# Patient Record
Sex: Male | Born: 1965 | Race: White | Hispanic: No | Smoking: Former smoker
Health system: Southern US, Community
[De-identification: ages and names within clinical notes are randomized; demographics above are authoritative.]

## PROBLEM LIST (undated history)

## (undated) DIAGNOSIS — B019 Varicella without complication: Secondary | ICD-10-CM

## (undated) DIAGNOSIS — E7800 Pure hypercholesterolemia, unspecified: Secondary | ICD-10-CM

## (undated) DIAGNOSIS — N4289 Other specified disorders of prostate: Secondary | ICD-10-CM

## (undated) DIAGNOSIS — J309 Allergic rhinitis, unspecified: Secondary | ICD-10-CM

## (undated) HISTORY — PX: LIPOMA EXCISION: SHX5283

## (undated) HISTORY — PX: OTHER SURGICAL HISTORY: SHX169

## (undated) HISTORY — DX: Varicella without complication: B01.9

---

## 2010-11-14 ENCOUNTER — Emergency Department: Payer: Self-pay | Admitting: Emergency Medicine

## 2012-01-12 ENCOUNTER — Emergency Department: Payer: Self-pay | Admitting: Emergency Medicine

## 2012-01-12 LAB — COMPREHENSIVE METABOLIC PANEL
Albumin: 4.2 g/dL (ref 3.4–5.0)
Alkaline Phosphatase: 59 U/L (ref 50–136)
Anion Gap: 6 — ABNORMAL LOW (ref 7–16)
BUN: 12 mg/dL (ref 7–18)
Bilirubin,Total: 0.7 mg/dL (ref 0.2–1.0)
Calcium, Total: 8.9 mg/dL (ref 8.5–10.1)
Chloride: 108 mmol/L — ABNORMAL HIGH (ref 98–107)
Co2: 28 mmol/L (ref 21–32)
Creatinine: 0.66 mg/dL (ref 0.60–1.30)
EGFR (African American): >60
EGFR (Non-African Amer.): >60
Glucose: 92 mg/dL (ref 65–99)
Potassium: 3.6 mmol/L (ref 3.5–5.1)
SGPT (ALT): 21 U/L (ref 12–78)
Sodium: 142 mmol/L (ref 136–145)

## 2012-01-12 LAB — CBC
HCT: 44.3 % (ref 40.0–52.0)
HGB: 15 g/dL (ref 13.0–18.0)
MCHC: 33.9 g/dL (ref 32.0–36.0)
RBC: 5.01 10*6/uL (ref 4.40–5.90)
RDW: 13.7 % (ref 11.5–14.5)
WBC: 4.4 10*3/uL (ref 3.8–10.6)

## 2014-12-01 ENCOUNTER — Ambulatory Visit (INDEPENDENT_AMBULATORY_CARE_PROVIDER_SITE_OTHER): Payer: 59 | Admitting: Podiatry

## 2014-12-01 ENCOUNTER — Encounter: Payer: Self-pay | Admitting: Podiatry

## 2014-12-01 ENCOUNTER — Ambulatory Visit (INDEPENDENT_AMBULATORY_CARE_PROVIDER_SITE_OTHER): Payer: 59

## 2014-12-01 VITALS — BP 138/92 | HR 61 | Resp 18

## 2014-12-01 DIAGNOSIS — M722 Plantar fascial fibromatosis: Secondary | ICD-10-CM

## 2014-12-01 MED ORDER — METHYLPREDNISOLONE 4 MG PO TBPK: ORAL_TABLET | ORAL | Status: DC

## 2014-12-01 MED ORDER — MELOXICAM 15 MG PO TABS: 15.0000 mg | ORAL_TABLET | Freq: Every day | ORAL | Status: DC

## 2014-12-01 NOTE — Patient Instructions (Addendum)
Start taking the medrol dose pack. Once finished take the mobic (meloxicam). Do not take together If was nice to meet you today. If you have any questions or any further concerns, please feel fee to give me a call. You can call our office at [phone] or please feel fee to send me a message through [place].    Plantar Fasciitis With Rehab The plantar fascia is a fibrous, ligament-like, soft-tissue structure that spans the bottom of the foot. Plantar fasciitis, also called heel spur syndrome, is a condition that causes pain in the foot due to inflammation of the tissue. SYMPTOMS   Pain and tenderness on the underneath side of the foot.  Pain that worsens with standing or walking. CAUSES  Plantar fasciitis is caused by irritation and injury to the plantar fascia on the underneath side of the foot. Common mechanisms of injury include:  Direct trauma to bottom of the foot.  Damage to a small nerve that runs under the foot where the main fascia attaches to the heel bone.  Stress placed on the plantar fascia due to bone spurs. RISK INCREASES WITH:   Activities that place stress on the plantar fascia (running, jumping, pivoting, or cutting).  Poor strength and flexibility.  Improperly fitted shoes.  Tight calf muscles.  Flat feet.  Failure to warm-up properly before activity.  Obesity. PREVENTION  Warm up and stretch properly before activity.  Allow for adequate recovery between workouts.  Maintain physical fitness:  Strength, flexibility, and endurance.  Cardiovascular fitness.  Maintain a health body weight.  Avoid stress on the plantar fascia.  Wear properly fitted shoes, including arch supports for individuals who have flat feet. PROGNOSIS  If treated properly, then the symptoms of plantar fasciitis usually resolve without surgery. However, occasionally surgery is necessary. RELATED COMPLICATIONS   Recurrent symptoms that may result in a chronic  condition.  Problems of the lower back that are caused by compensating for the injury, such as limping.  Pain or weakness of the foot during push-off following surgery.  Chronic inflammation, scarring, and partial or complete fascia tear, occurring more often from repeated injections. TREATMENT  Treatment initially involves the use of ice and medication to help reduce pain and inflammation. The use of strengthening and stretching exercises may help reduce pain with activity, especially stretches of the Achilles tendon. These exercises may be performed at home or with a therapist. Your caregiver may recommend that you use heel cups of arch supports to help reduce stress on the plantar fascia. Occasionally, corticosteroid injections are given to reduce inflammation. If symptoms persist for greater than 6 months despite non-surgical (conservative), then surgery may be recommended.  MEDICATION   If pain medication is necessary, then nonsteroidal anti-inflammatory medications, such as aspirin and ibuprofen, or other minor pain relievers, such as acetaminophen, are often recommended.  Do not take pain medication within 7 days before surgery.  Prescription pain relievers may be given if deemed necessary by your caregiver. Use only as directed and only as much as you need.  Corticosteroid injections may be given by your caregiver. These injections should be reserved for the most serious cases, because they may only be given a certain number of times. HEAT AND COLD  Cold treatment (icing) relieves pain and reduces inflammation. Cold treatment should be applied for 10 to 15 minutes every 2 to 3 hours for inflammation and pain and immediately after any activity that aggravates your symptoms. Use ice packs or massage the area with a piece  of ice (ice massage).  Heat treatment may be used prior to performing the stretching and strengthening activities prescribed by your caregiver, physical therapist, or  athletic trainer. Use a heat pack or soak the injury in warm water. SEEK IMMEDIATE MEDICAL CARE IF:  Treatment seems to offer no benefit, or the condition worsens.  Any medications produce adverse side effects. EXERCISES RANGE OF MOTION (ROM) AND STRETCHING EXERCISES - Plantar Fasciitis (Heel Spur Syndrome) These exercises may help you when beginning to rehabilitate your injury. Your symptoms may resolve with or without further involvement from your physician, physical therapist or athletic trainer. While completing these exercises, remember:   Restoring tissue flexibility helps normal motion to return to the joints. This allows healthier, less painful movement and activity.  An effective stretch should be held for at least 30 seconds.  A stretch should never be painful. You should only feel a gentle lengthening or release in the stretched tissue. RANGE OF MOTION - Toe Extension, Flexion  Sit with your right / left leg crossed over your opposite knee.  Grasp your toes and gently pull them back toward the top of your foot. You should feel a stretch on the bottom of your toes and/or foot.  Hold this stretch for __________ seconds.  Now, gently pull your toes toward the bottom of your foot. You should feel a stretch on the top of your toes and or foot.  Hold this stretch for __________ seconds. Repeat __________ times. Complete this stretch __________ times per day.  RANGE OF MOTION - Ankle Dorsiflexion, Active Assisted  Remove shoes and sit on a chair that is preferably not on a carpeted surface.  Place right / left foot under knee. Extend your opposite leg for support.  Keeping your heel down, slide your right / left foot back toward the chair until you feel a stretch at your ankle or calf. If you do not feel a stretch, slide your bottom forward to the edge of the chair, while still keeping your heel down.  Hold this stretch for __________ seconds. Repeat __________ times. Complete  this stretch __________ times per day.  STRETCH - Gastroc, Standing  Place hands on wall.  Extend right / left leg, keeping the front knee somewhat bent.  Slightly point your toes inward on your back foot.  Keeping your right / left heel on the floor and your knee straight, shift your weight toward the wall, not allowing your back to arch.  You should feel a gentle stretch in the right / left calf. Hold this position for __________ seconds. Repeat __________ times. Complete this stretch __________ times per day. STRETCH - Soleus, Standing  Place hands on wall.  Extend right / left leg, keeping the other knee somewhat bent.  Slightly point your toes inward on your back foot.  Keep your right / left heel on the floor, bend your back knee, and slightly shift your weight over the back leg so that you feel a gentle stretch deep in your back calf.  Hold this position for __________ seconds. Repeat __________ times. Complete this stretch __________ times per day. STRETCH - Gastrocsoleus, Standing  Note: This exercise can place a lot of stress on your foot and ankle. Please complete this exercise only if specifically instructed by your caregiver.   Place the ball of your right / left foot on a step, keeping your other foot firmly on the same step.  Hold on to the wall or a rail for balance.  Slowly lift your other foot, allowing your body weight to press your heel down over the edge of the step.  You should feel a stretch in your right / left calf.  Hold this position for __________ seconds.  Repeat this exercise with a slight bend in your right / left knee. Repeat __________ times. Complete this stretch __________ times per day.  STRENGTHENING EXERCISES - Plantar Fasciitis (Heel Spur Syndrome)  These exercises may help you when beginning to rehabilitate your injury. They may resolve your symptoms with or without further involvement from your physician, physical therapist or athletic  trainer. While completing these exercises, remember:   Muscles can gain both the endurance and the strength needed for everyday activities through controlled exercises.  Complete these exercises as instructed by your physician, physical therapist or athletic trainer. Progress the resistance and repetitions only as guided. STRENGTH - Towel Curls  Sit in a chair positioned on a non-carpeted surface.  Place your foot on a towel, keeping your heel on the floor.  Pull the towel toward your heel by only curling your toes. Keep your heel on the floor.  If instructed by your physician, physical therapist or athletic trainer, add ____________________ at the end of the towel. Repeat __________ times. Complete this exercise __________ times per day. STRENGTH - Ankle Inversion  Secure one end of a rubber exercise band/tubing to a fixed object (table, pole). Loop the other end around your foot just before your toes.  Place your fists between your knees. This will focus your strengthening at your ankle.  Slowly, pull your big toe up and in, making sure the band/tubing is positioned to resist the entire motion.  Hold this position for __________ seconds.  Have your muscles resist the band/tubing as it slowly pulls your foot back to the starting position. Repeat __________ times. Complete this exercises __________ times per day.    This information is not intended to replace advice given to you by your health care provider. Make sure you discuss any questions you have with your health care provider.   Document Released: 12/19/2004 Document Revised: 05/05/2014 Document Reviewed: 04/02/2008 Elsevier Interactive Patient Education [employer].

## 2014-12-01 NOTE — Progress Notes (Signed)
   Subjective:    Patient ID: Roger Kim, male    DOB: 1965/03/06, 49 y.o.   MRN: [id number]  HPI   49 year old male presents the operative concerns her right heel pain which has been ongoing the last 3 months. He states the majority of pain is when he sits down for some time he gets back up to stand. He does have some pain after he stands all day at work while working on [employer], [employer]. He denies any swelling or redness. No recent injury or trauma. No change increased activity recently. He did changes shoes time the pain started. He said no previous treatment. No tingling or numbness. No claudication symptoms. The pain does not wake him up at night. No other complaints at this time.  Review of Systems  All other systems reviewed and are negative.      Objective:   Physical Exam General: AAO x3, NAD  Dermatological: Skin is warm, dry and supple bilateral. Nails x 10 are well manicured; remaining integument appears unremarkable at this time. There are no open sores, no preulcerative lesions, no rash or signs of infection present.  Vascular: Dorsalis Pedis artery and Posterior Tibial artery pedal pulses are 2/4 bilateral with immedate capillary fill time. Pedal hair growth present. No varicosities and no lower extremity edema present bilateral. There is no pain with calf compression, swelling, warmth, erythema.   Neruologic: Grossly intact via light touch bilateral. Vibratory intact via tuning fork bilateral. Protective threshold with Semmes Wienstein monofilament intact to all pedal sites bilateral. Patellar and Achilles deep tendon reflexes 2+ bilateral. No Babinski or clonus noted bilateral.   Musculoskeletal:  There is tenderness palpation along the plantar medial tubercle of the calcaneus at the insertion of the plantar fascia on the right foot. There is no canal course of plantar fascial in the arch foot is no evidence of tearing. There is no pain with lateral  compression of the calcaneus or pain with vibratory sensation. There is of course/insertional the achilles tendon. No other areas of tenderness to bilateral lower extremities. No pain, crepitus, or limitation noted with foot and ankle range of motion bilateral. Muscular strength 5/5 in all groups tested bilateral.  Gait: Unassisted, Nonantalgic.       Assessment & Plan:   49 year old male right heel pain, likely plantar fasciitis -Treatment options discussed including all alternatives, risks, and complications -Etiology of symptoms were discussed -X-rays were obtained and reviewed with the patient.  -Patient elects to proceed with steroid injection into the right heel. Under sterile skin preparation, a total of 2.5cc of kenalog 10, 0.5% Marcaine plain, and 2% lidocaine plain were infiltrated into the symptomatic area without complication. A band-aid was applied. Patient tolerated the injection well without complication. Post-injection care with discussed with the patient. Discussed with the patient to ice the area over the next couple of days to help prevent a steroid flare.  - Prescribed a Medrol Dosepak. Once this is complete he can start meloxicam which is also  Prescribed. -Plantar fascial brace dispensed -Ice and stretching exercises daily. -continue supportive shoe gear. -discuss orthotics. -Follow-up in 3 weeks or sooner if any problems arise. In the meantime, encouraged to call the office with any questions, concerns, change in symptoms.   Celesta Gentile, DPM

## 2014-12-22 ENCOUNTER — Ambulatory Visit (INDEPENDENT_AMBULATORY_CARE_PROVIDER_SITE_OTHER): Payer: 59 | Admitting: Podiatry

## 2014-12-22 ENCOUNTER — Encounter: Payer: Self-pay | Admitting: Podiatry

## 2014-12-22 VITALS — BP 117/75 | HR 73 | Resp 18

## 2014-12-22 DIAGNOSIS — M722 Plantar fascial fibromatosis: Secondary | ICD-10-CM

## 2014-12-22 NOTE — Progress Notes (Signed)
Patient ID: Roger Kim, male   DOB: 24-Sep-1965, 49 y.o.   MRN: [id number]  Subjective: Roger Kim presents to the office today for follow-up evaluation of right heel pain. He states that they are doing much better and was previously on the distal have some discomfort to his heel. They have been icing, stretching, try to wear supportive shoe as much as possible.he has finished her course Medrol Dosepak he is been taken meloxicam without any side effects. He discontinued the plantar fascial brace. He does have a Dr. Felicie Morn plantar fascial insert. No recent injury,. No swelling or redness  No other complaints at this time. No acute changes since last appointment. They deny any systemic complaints such as fevers, chills, nausea, vomiting.  Objective: General: AAO x3, NAD  Dermatological: Skin is warm, dry and supple bilateral. Nails x 10 are well manicured; remaining integument appears unremarkable at this time. There are no open sores, no preulcerative lesions, no rash or signs of infection present.  Vascular: Dorsalis Pedis artery and Posterior Tibial artery pedal pulses are 2/4 bilateral with immedate capillary fill time. Pedal hair growth present. There is no pain with calf compression, swelling, warmth, erythema.   Neruologic: Grossly intact via light touch bilateral. Vibratory intact via tuning fork bilateral. Protective threshold with Semmes Wienstein monofilament intact to all pedal sites bilateral.   Musculoskeletal: There is mild but  continued tenderness palpation along the plantar medial tubercle of the calcaneus at the insertion of the plantar fascia on the right foot. There is no pain along the course of the plantar fascia within the arch of the foot. Plantar fascia appears to be intact bilaterally. There is no pain with lateral compression of the calcaneus and there is no pain with vibratory sensation. There is no pain along the course or insertion of the Achilles tendon. There  are no other areas of tenderness to bilateral lower extremities. No gross boney pedal deformities bilateral. No pain, crepitus, or limitation noted with foot and ankle range of motion bilateral. Muscular strength 5/5 in all groups tested bilateral.  Gait: Unassisted, Nonantalgic.   Assessment: Presents for follow-up evaluation for heel pain, likely plantar fasciitis with improved symptoms.   Plan: -Treatment options discussed including all alternatives, risks, and complications -Patient elects to proceed with steroid injection into the right heel. Under sterile skin preparation, a total of 2.5cc of kenalog 10, 0.5% Marcaine plain, and 2% lidocaine plain were infiltrated into the symptomatic area without complication. A band-aid was applied. Patient tolerated the injection well without complication. Post-injection care with discussed with the patient. Discussed with the patient to ice the area over the next couple of days to help prevent a steroid flare.  -Removal of plantar fascial taking was applied. -Ice and stretching exercises on a daily basis. -Continue supportive shoe gear. Discussed CMO -Continue mobic -Follow-up in 3 weeks or sooner if any problems arise. In the meantime, encouraged to call the office with any questions, concerns, change in symptoms.   Celesta Gentile, DPM

## 2015-01-12 ENCOUNTER — Ambulatory Visit: Payer: 59 | Admitting: Podiatry

## 2015-11-03 ENCOUNTER — Ambulatory Visit
Admission: EM | Admit: 2015-11-03 | Discharge: 2015-11-03 | Disposition: A | Discharge status: Home / Self Care | Payer: BLUE CROSS/BLUE SHIELD | Attending: Emergency Medicine | Admitting: Emergency Medicine

## 2015-11-03 DIAGNOSIS — J40 Bronchitis, not specified as acute or chronic: Secondary | ICD-10-CM

## 2015-11-03 DIAGNOSIS — J019 Acute sinusitis, unspecified: Secondary | ICD-10-CM

## 2015-11-03 HISTORY — DX: Allergic rhinitis, unspecified: J30.9

## 2015-11-03 MED ORDER — BENZONATATE 200 MG PO CAPS: 200.0000 mg | ORAL_CAPSULE | Freq: Three times a day (TID) | ORAL | 0 refills | Status: DC | PRN

## 2015-11-03 MED ORDER — PREDNISONE 50 MG PO TABS: 50.0000 mg | ORAL_TABLET | Freq: Every day | ORAL | 0 refills | Status: DC

## 2015-11-03 MED ORDER — DOXYCYCLINE HYCLATE 100 MG PO CAPS: 100.0000 mg | ORAL_CAPSULE | Freq: Two times a day (BID) | ORAL | 0 refills | Status: DC

## 2015-11-03 MED ORDER — IBUPROFEN 800 MG PO TABS: 800.0000 mg | ORAL_TABLET | Freq: Three times a day (TID) | ORAL | 0 refills | Status: DC | PRN

## 2015-11-03 MED ORDER — AEROCHAMBER PLUS MISC: 2 refills | Status: DC

## 2015-11-03 MED ORDER — ALBUTEROL SULFATE HFA 108 (90 BASE) MCG/ACT IN AERS: 1.0000 | INHALATION_SPRAY | Freq: Four times a day (QID) | RESPIRATORY_TRACT | 0 refills | Status: DC | PRN

## 2015-11-03 NOTE — Discharge Instructions (Signed)
Take the medication as written. You may take 800 mg of motrin with 1 gram of tylenol up to 3 times a day as needed for pain. This is an effective combination for pain.   Use a neti pot or the NeilMed sinus rinse as often as you want to to reduce nasal congestion. Follow the directions on the box. Continue Mucinex. Restart Flonase.  Go to www.goodrx.com to look up your medications. This will give you a list of where you can find your prescriptions at the most affordable prices.

## 2015-11-03 NOTE — ED Triage Notes (Signed)
Patient complains of cough, congestion, sinus pain and pressure. Patient states that symptoms started 3.5 weeks ago. Patient states that he has tried and failed mucinex and dayquil. Patient states that he feels like congestion has resided in his chest.

## 2015-11-03 NOTE — ED Provider Notes (Signed)
HPI  SUBJECTIVE:  Roger Kim is a 50 y.o. male who presents with 3-1/2 weeks of nasal congestion and sinus pressure along the nasal bridge, purulent nasal drainage, postnasal drip. He reports a cough productive of yellowish mucus. He reports some wheezing particularly worse at night. He tried DayQuil, NyQuil, Mucinex DM. He Mucinex seemed to help his cough. His cough is worse with laughing. He denies dental pain, face swelling, chest pain, shortness of breath, fevers. He states that he is sleeping okay at night. No antipyretic in the past 6-8 hours. States that he has used Flonase in the past with success, but has not had any recently. He has a past medical history of sinusitis and states that these symptoms are identical to previous episodes. He is a former smoker. He has a past medical history of seasonal allergies. No history of diabetes, hypertension, asthma, emphysema, COPD. PMD: None.    Past Medical History:  Diagnosis Date  . Allergic rhinitis     Past Surgical History:  Procedure Laterality Date  . NO PAST SURGERIES      History reviewed. No pertinent family history.  Social History  Substance Use Topics  . Smoking status: Former Research scientist (life sciences)  . Smokeless tobacco: Never Used  . Alcohol use No    No current facility-administered medications for this encounter.   Current Outpatient Prescriptions:  .  Ascorbic Acid (VITAMIN C) 1000 MG tablet, Take 1,000 mg by mouth daily., Disp: , Rfl:  .  Multiple Vitamin (MULTIVITAMIN) capsule, Take 1 capsule by mouth daily., Disp: , Rfl:  .  albuterol (PROVENTIL HFA;VENTOLIN HFA) 108 (90 Base) MCG/ACT inhaler, Inhale 1-2 puffs into the lungs every 6 (six) hours as needed for wheezing or shortness of breath., Disp: 1 Inhaler, Rfl: 0 .  benzonatate (TESSALON) 200 MG capsule, Take 1 capsule (200 mg total) by mouth 3 (three) times daily as needed for cough., Disp: 20 capsule, Rfl: 0 .  doxycycline (VIBRAMYCIN) 100 MG capsule, Take 1 capsule (100  mg total) by mouth 2 (two) times daily. X 7 days, Disp: 14 capsule, Rfl: 0 .  ibuprofen (ADVIL,MOTRIN) 800 MG tablet, Take 1 tablet (800 mg total) by mouth every 8 (eight) hours as needed., Disp: 30 tablet, Rfl: 0 .  predniSONE (DELTASONE) 50 MG tablet, Take 1 tablet (50 mg total) by mouth daily with breakfast., Disp: 5 tablet, Rfl: 0 .  Spacer/Aero-Holding Chambers (AEROCHAMBER PLUS) inhaler, Use as instructed, Disp: 1 each, Rfl: 2  Allergies  Allergen Reactions  . Penicillins      ROS  As noted in HPI.   Physical Exam  BP 125/77 (BP Location: Left Arm)   Pulse 70   Temp 98.2 F (36.8 C) (Oral)   Resp 16   Ht 5\' 11"  (1.803 m)   Wt 190 lb (86.2 kg)   SpO2 97%   BMI 26.50 kg/m   Constitutional: Well developed, well nourished, no acute distress Eyes:  EOMI, conjunctiva normal bilaterally HENT: Normocephalic, atraumatic,mucus membranes moist. TMs normal bilaterally. Positive purulent nasal congestion. Slightly erythematous swollen turbinates. No maxillary or frontal sinus tenderness. Positive postnasal drip with cobblestoning. Respiratory: Normal inspiratory effort good air movement, rhonchi left lower lobe. No wheezing. Cardiovascular: Normal rate regular rhythm no murmurs rubs or gallops  GI: nondistended skin: No rash, skin intact Musculoskeletal: no deformities Neurologic: Alert & oriented x 3, no focal neuro deficits Psychiatric: Speech and behavior appropriate   ED Course   Medications - No data to display  No orders of  the defined types were placed in this encounter.   No results found for this or any previous visit (from the past 24 hour(s)). No results found.  ED Clinical Impression  Acute non-recurrent sinusitis, unspecified location  Bronchitis   ED Assessment/Plan  Presentation most consistent with a sinusitis and bronchitis. Given his duration of symptoms, patient meets criteria for antibiotics. Plan to send home with doxycycline, he is to start  saline nasal irrigation and restart his Flonase and to continue Mucinex. For the bronchitis we'll send home with albuterol with spacer, short course of steroids and some Tessalon Perles. He will follow-up with a primary care physician of his choice, providing several names.  Discussed MDM, plan and followup with patient. Patient agrees with plan.   Meds ordered this encounter  Medications  . Ascorbic Acid (VITAMIN C) 1000 MG tablet    Sig: Take 1,000 mg by mouth daily.  . Multiple Vitamin (MULTIVITAMIN) capsule    Sig: Take 1 capsule by mouth daily.  Marland Kitchen doxycycline (VIBRAMYCIN) 100 MG capsule    Sig: Take 1 capsule (100 mg total) by mouth 2 (two) times daily. X 7 days    Dispense:  14 capsule    Refill:  0  . ibuprofen (ADVIL,MOTRIN) 800 MG tablet    Sig: Take 1 tablet (800 mg total) by mouth every 8 (eight) hours as needed.    Dispense:  30 tablet    Refill:  0  . predniSONE (DELTASONE) 50 MG tablet    Sig: Take 1 tablet (50 mg total) by mouth daily with breakfast.    Dispense:  5 tablet    Refill:  0  . benzonatate (TESSALON) 200 MG capsule    Sig: Take 1 capsule (200 mg total) by mouth 3 (three) times daily as needed for cough.    Dispense:  20 capsule    Refill:  0  . albuterol (PROVENTIL HFA;VENTOLIN HFA) 108 (90 Base) MCG/ACT inhaler    Sig: Inhale 1-2 puffs into the lungs every 6 (six) hours as needed for wheezing or shortness of breath.    Dispense:  1 Inhaler    Refill:  0  . Spacer/Aero-Holding Chambers (AEROCHAMBER PLUS) inhaler    Sig: Use as instructed    Dispense:  1 each    Refill:  2    *This clinic note was created using Dragon dictation software. Therefore, there may be occasional mistakes despite careful proofreading.  ?   Melynda Ripple, MD 11/03/15 1335

## 2016-03-12 ENCOUNTER — Encounter: Payer: Self-pay | Admitting: Emergency Medicine

## 2016-03-12 ENCOUNTER — Ambulatory Visit
Admission: EM | Admit: 2016-03-12 | Discharge: 2016-03-12 | Disposition: A | Discharge status: Home / Self Care | Payer: BLUE CROSS/BLUE SHIELD | Attending: Family Medicine | Admitting: Family Medicine

## 2016-03-12 DIAGNOSIS — H60331 Swimmer's ear, right ear: Secondary | ICD-10-CM | POA: Diagnosis not present

## 2016-03-12 MED ORDER — NEOMYCIN-POLYMYXIN-HC 3.5-10000-1 OT SUSP: 3.0000 [drp] | Freq: Three times a day (TID) | OTIC | 0 refills | Status: DC

## 2016-03-12 NOTE — ED Provider Notes (Signed)
[facility]    CSN: [id number] Arrival date & time: 03/12/16  1209     History   Chief Complaint Chief Complaint  Patient presents with  . Otalgia    right ear    HPI Roger Kim is a 51 y.o. male.   The history is provided by the patient.  Otalgia  Location:  Right Quality:  Aching Severity:  Mild Onset quality:  Sudden Duration:  7 days Timing:  Constant Progression:  Worsening Chronicity:  New Context: water in ear   Associated symptoms: ear discharge   Associated symptoms: no abdominal pain, no congestion, no cough, no diarrhea, no fever, no headaches, no hearing loss, no neck pain, no rash, no rhinorrhea, no sore throat, no tinnitus and no vomiting   Risk factors: no chronic ear infection and no prior ear surgery     Past Medical History:  Diagnosis Date  . Allergic rhinitis     Patient Active Problem List   Diagnosis Date Noted  . Plantar fasciitis 12/01/2014    Past Surgical History:  Procedure Laterality Date  . FRACTURE SURGERY    . NO PAST SURGERIES         Home Medications    Prior to Admission medications   Medication Sig Start Date End Date Taking? Authorizing Provider  fluticasone (FLONASE) 50 MCG/ACT nasal spray Place 1 spray into both nostrils daily.   Yes Historical Provider, MD  albuterol (PROVENTIL HFA;VENTOLIN HFA) 108 (90 Base) MCG/ACT inhaler Inhale 1-2 puffs into the lungs every 6 (six) hours as needed for wheezing or shortness of breath. 11/03/15   Melynda Ripple, MD  Ascorbic Acid (VITAMIN C) 1000 MG tablet Take 1,000 mg by mouth daily.    Historical Provider, MD  ibuprofen (ADVIL,MOTRIN) 800 MG tablet Take 1 tablet (800 mg total) by mouth every 8 (eight) hours as needed. 11/03/15   Melynda Ripple, MD  Multiple Vitamin (MULTIVITAMIN) capsule Take 1 capsule by mouth daily.    Historical Provider, MD  neomycin-polymyxin-hydrocortisone (CORTISPORIN) 3.5-10000-1 otic suspension Place 3 drops into the right ear 3  (three) times daily. 03/12/16   Norval Gable, MD  Spacer/Aero-Holding Chambers (AEROCHAMBER PLUS) inhaler Use as instructed 11/03/15   Melynda Ripple, MD    Family History History reviewed. No pertinent family history.  Social History Social History  Substance Use Topics  . Smoking status: Former Research scientist (life sciences)  . Smokeless tobacco: Never Used  . Alcohol use No     Allergies   Penicillins   Review of Systems Review of Systems  Constitutional: Negative for fever.  HENT: Positive for ear discharge and ear pain. Negative for congestion, hearing loss, rhinorrhea, sore throat and tinnitus.   Respiratory: Negative for cough.   Gastrointestinal: Negative for abdominal pain, diarrhea and vomiting.  Musculoskeletal: Negative for neck pain.  Skin: Negative for rash.  Neurological: Negative for headaches.     Physical Exam Triage Vital Signs ED Triage Vitals  Enc Vitals Group     BP 03/12/16 1313 102/83     Pulse Rate 03/12/16 1313 64     Resp 03/12/16 1313 16     Temp 03/12/16 1313 98.1 F (36.7 C)     Temp Source 03/12/16 1313 Oral     SpO2 03/12/16 1313 99 %     Weight 03/12/16 1311 190 lb (86.2 kg)     Height 03/12/16 1311 5\' 11"  (1.803 m)     Head Circumference --      Peak Flow --  Pain Score 03/12/16 1312 0     Pain Loc --      Pain Edu? --      Excl. in [place]? --    No data found.   Updated Vital Signs BP 102/83 (BP Location: Left Arm)   Pulse 64   Temp 98.1 F (36.7 C) (Oral)   Resp 16   Ht 5\' 11"  (1.803 m)   Wt 190 lb (86.2 kg)   SpO2 99%   BMI 26.50 kg/m   Visual Acuity Right Eye Distance:   Left Eye Distance:   Bilateral Distance:    Right Eye Near:   Left Eye Near:    Bilateral Near:     Physical Exam  Constitutional: He appears well-developed and well-nourished. No distress.  HENT:  Right Ear: There is drainage.  Left Ear: Hearing, tympanic membrane, external ear and ear canal normal.  Right ear canal swollen, erythematous and with  drainage  Skin: He is not diaphoretic.  Nursing note and vitals reviewed.    UC Treatments / Results  Labs (all labs ordered are listed, but only abnormal results are displayed) Labs Reviewed - No data to display  EKG  EKG Interpretation None       Radiology No results found.  Procedures Procedures (including critical care time)  Medications Ordered in UC Medications - No data to display   Initial Impression / Assessment and Plan / UC Course  I have reviewed the triage vital signs and the nursing notes.  Pertinent labs & imaging results that were available during my care of the patient were reviewed by me and considered in my medical decision making (see chart for details).       Final Clinical Impressions(s) / UC Diagnoses   Final diagnoses:  Acute swimmer's ear of right side    New Prescriptions Discharge Medication List as of 03/12/2016  1:51 PM    START taking these medications   Details  neomycin-polymyxin-hydrocortisone (CORTISPORIN) 3.5-10000-1 otic suspension Place 3 drops into the right ear 3 (three) times daily., Starting Sun 03/12/2016, Normal       1.diagnosis reviewed with patient 2. rx as per orders above; reviewed possible side effects, interactions, risks and benefits  3. Recommend supportive treatment with otc analgesics prn 4. Follow-up prn if symptoms worsen or don't improve   Norval Gable, MD 03/12/16 1438

## 2016-03-12 NOTE — ED Triage Notes (Signed)
Patient c/o right ear pain and drainage for the past 6 days.  Patient denies fevers.

## 2016-06-22 ENCOUNTER — Ambulatory Visit (INDEPENDENT_AMBULATORY_CARE_PROVIDER_SITE_OTHER): Payer: BLUE CROSS/BLUE SHIELD | Admitting: Family Medicine

## 2016-06-22 ENCOUNTER — Encounter: Payer: Self-pay | Admitting: Family Medicine

## 2016-06-22 ENCOUNTER — Ambulatory Visit (INDEPENDENT_AMBULATORY_CARE_PROVIDER_SITE_OTHER): Payer: BLUE CROSS/BLUE SHIELD

## 2016-06-22 DIAGNOSIS — N401 Enlarged prostate with lower urinary tract symptoms: Secondary | ICD-10-CM | POA: Insufficient documentation

## 2016-06-22 DIAGNOSIS — Z130 Encounter for screening for diseases of the blood and blood-forming organs and certain disorders involving the immune mechanism: Secondary | ICD-10-CM | POA: Diagnosis not present

## 2016-06-22 DIAGNOSIS — Z125 Encounter for screening for malignant neoplasm of prostate: Secondary | ICD-10-CM

## 2016-06-22 DIAGNOSIS — R030 Elevated blood-pressure reading, without diagnosis of hypertension: Secondary | ICD-10-CM | POA: Diagnosis not present

## 2016-06-22 DIAGNOSIS — M5442 Lumbago with sciatica, left side: Secondary | ICD-10-CM | POA: Diagnosis not present

## 2016-06-22 DIAGNOSIS — Z13220 Encounter for screening for lipoid disorders: Secondary | ICD-10-CM | POA: Diagnosis not present

## 2016-06-22 DIAGNOSIS — G8929 Other chronic pain: Secondary | ICD-10-CM | POA: Insufficient documentation

## 2016-06-22 DIAGNOSIS — M545 Low back pain: Secondary | ICD-10-CM

## 2016-06-22 DIAGNOSIS — R351 Nocturia: Secondary | ICD-10-CM

## 2016-06-22 DIAGNOSIS — Z114 Encounter for screening for human immunodeficiency virus [HIV]: Secondary | ICD-10-CM

## 2016-06-22 DIAGNOSIS — N400 Enlarged prostate without lower urinary tract symptoms: Secondary | ICD-10-CM | POA: Insufficient documentation

## 2016-06-22 LAB — POCT URINALYSIS DIPSTICK
BILIRUBIN UA: NEGATIVE
Blood, UA: NEGATIVE
GLUCOSE UA: NEGATIVE
KETONES UA: NEGATIVE
LEUKOCYTES UA: NEGATIVE
Nitrite, UA: NEGATIVE
Protein, UA: NEGATIVE
Spec Grav, UA: 1.02 (ref 1.010–1.025)
Urobilinogen, UA: 0.2 E.U./dL
pH, UA: 7 (ref 5.0–8.0)

## 2016-06-22 LAB — CBC
HEMATOCRIT: 45.5 % (ref 39.0–52.0)
Hemoglobin: 15.3 g/dL (ref 13.0–17.0)
MCHC: 33.7 g/dL (ref 30.0–36.0)
MCV: 88.5 fl (ref 78.0–100.0)
PLATELETS: 219 10*3/uL (ref 150.0–400.0)
RBC: 5.14 Mil/uL (ref 4.22–5.81)
RDW: 13.6 % (ref 11.5–15.5)
WBC: 4.5 10*3/uL (ref 4.0–10.5)

## 2016-06-22 LAB — COMPREHENSIVE METABOLIC PANEL
ALBUMIN: 4.4 g/dL (ref 3.5–5.2)
ALT: 13 U/L (ref 0–53)
AST: 14 U/L (ref 0–37)
Alkaline Phosphatase: 44 U/L (ref 39–117)
BUN: 19 mg/dL (ref 6–23)
CO2: 32 mEq/L (ref 19–32)
Calcium: 10.1 mg/dL (ref 8.4–10.5)
Chloride: 104 mEq/L (ref 96–112)
Creatinine, Ser: 0.84 mg/dL (ref 0.40–1.50)
GFR: 101.89 mL/min (ref >60.00)
Glucose, Bld: 105 mg/dL — ABNORMAL HIGH (ref 70–99)
POTASSIUM: 5 meq/L (ref 3.5–5.1)
SODIUM: 141 meq/L (ref 135–145)
Total Bilirubin: 0.5 mg/dL (ref 0.2–1.2)
Total Protein: 7 g/dL (ref 6.0–8.3)

## 2016-06-22 LAB — PSA: PSA: 1.6 ng/mL (ref 0.10–4.00)

## 2016-06-22 LAB — LIPID PANEL
Cholesterol: 225 mg/dL — ABNORMAL HIGH (ref 0–200)
HDL: 41.5 mg/dL (ref >39.00)
LDL CALC: 158 mg/dL — AB (ref 0–99)
NonHDL: 183.78
TRIGLYCERIDES: 130 mg/dL (ref 0.0–149.0)
Total CHOL/HDL Ratio: 5
VLDL: 26 mg/dL (ref 0.0–40.0)

## 2016-06-22 LAB — HEMOGLOBIN A1C: Hgb A1c MFr Bld: 6.2 % (ref 4.6–6.5)

## 2016-06-22 MED ORDER — CELECOXIB 100 MG PO CAPS: 100.0000 mg | ORAL_CAPSULE | Freq: Two times a day (BID) | ORAL | 1 refills | Status: DC

## 2016-06-22 NOTE — Assessment & Plan Note (Signed)
New problem. Patient seems to be experiencing lumbar radiculopathy. Obtaining x-ray today. Starting on Celebrex.

## 2016-06-22 NOTE — Progress Notes (Signed)
Pre-visit discussion using our clinic review tool. No additional management support is needed unless otherwise documented below in the visit note.  

## 2016-06-22 NOTE — Progress Notes (Signed)
Subjective:  Patient ID: Roger Kim, male    DOB: October 29, 1965  Age: 51 y.o. MRN: [id number]  CC: Low back pain, Urinary frequency at night  HPI Roger Kim is a 51 y.o. male presents to the clinic today as a new patient with the above complaints.  Low back pain, leg pain  Patient reports that 5 weeks ago he began to have left-sided low back pain.  It has persisted.  Severe at times. Currently mild.  He reports associated numbness and tingling of his left leg - particularly the thigh and radiates down.  He has taken Aleve and ibuprofen with some improvement.  Worse with prolonged activity.  No reports of bladder or bowel incontinence.  No saddle anesthesia.  No other associated symptoms.  Nocturia  Patient reports a six-month history of nocturia.  No reports of dysuria.  No hematuria.  No known relieving factors.  No flank pain.  He is concerned that he may be experiencing symptoms of enlarged prostate.  PMH, Surgical Hx, Family Hx, Social History reviewed and updated as below.  Past Medical History:  Diagnosis Date  . Allergic rhinitis   . Chicken pox    Past Surgical History:  Procedure Laterality Date  . FRACTURE SURGERY     Family History  Problem Relation Age of Onset  . Alzheimer's disease Mother   . Cancer Father   . Arthritis Sister   . Breast cancer Sister    Social History  Substance Use Topics  . Smoking status: Former Research scientist (life sciences)  . Smokeless tobacco: Never Used  . Alcohol use No   Review of Systems  HENT: Positive for tinnitus.   Genitourinary:       Nocturia.  Musculoskeletal: Positive for back pain.  Skin:       Multiple lipomas.  Neurological: Positive for numbness.  All other systems reviewed and are negative.   Objective:   Today's Vitals: BP 140/76 (BP Location: Left Arm, Patient Position: Sitting, Cuff Size: Normal)   Pulse 70   Temp 98.3 F (36.8 C) (Oral)   Resp 12   Ht 5\' 11"  (1.803 m)   Wt 188 lb 3.2 oz  (85.4 kg)   SpO2 98%   BMI 26.25 kg/m   Physical Exam  Constitutional: He is oriented to person, place, and time. He appears well-developed. No distress.  HENT:  Head: Normocephalic and atraumatic.  Mouth/Throat: Oropharynx is clear and moist.  Eyes: Conjunctivae are normal. No scleral icterus.  Neck: Neck supple. No thyromegaly present.  Cardiovascular: Normal rate and regular rhythm.   No murmur heard. Pulmonary/Chest: Effort normal. He has no wheezes. He has no rales.  Abdominal: Soft. He exhibits no distension. There is no tenderness. There is no rebound and no guarding.  Genitourinary:  Genitourinary Comments: Mildly enlarged prostate. No mass.  Musculoskeletal:  Lumbar spine - mild tenderness of the left paraspinal musculature. Negative straight leg raise.  Lymphadenopathy:    He has no cervical adenopathy.  Neurological: He is alert and oriented to person, place, and time.  Skin: Skin is warm. No rash noted.  Multiple lipomas noted.   Psychiatric: He has a normal mood and affect.  Vitals reviewed.   Assessment & Plan:   Problem List Items Addressed This Visit      Other   Acute left-sided low back pain with left-sided sciatica    New problem. Patient seems to be experiencing lumbar radiculopathy. Obtaining x-ray today. Starting on Celebrex.  Relevant Medications   celecoxib (CELEBREX) 100 MG capsule   Other Relevant Orders   DG Lumbar Spine Complete (Completed)   Nocturia    New problem. Likely from BPH given exam. Labs today. UA negative. Awaiting PSA. Will plan to start Flomax once labs return.        Relevant Orders   Hemoglobin A1c   POCT Urinalysis Dipstick (Completed)    Other Visit Diagnoses    Screening for deficiency anemia       Relevant Orders   CBC   Elevated BP without diagnosis of hypertension       Relevant Orders   Comprehensive metabolic panel   Screening, lipid       Relevant Orders   Lipid panel   Prostate cancer screening        Relevant Orders   PSA   Screening for HIV (human immunodeficiency virus)       Relevant Orders   HIV antibody (with reflex)      Meds ordered this encounter  Medications  . celecoxib (CELEBREX) 100 MG capsule    Sig: Take 1 capsule (100 mg total) by mouth 2 (two) times daily.    Dispense:  60 capsule    Refill:  1    Follow-up: Pending workup  [place]

## 2016-06-22 NOTE — Assessment & Plan Note (Signed)
New problem. Likely from BPH given exam. Labs today. UA negative. Awaiting PSA. Will plan to start Flomax once labs return.

## 2016-06-22 NOTE — Patient Instructions (Signed)
We will call with your results.  Consider shingles vaccine, colonoscopy, tetanus.  We will arrange follow up based on your results.  Take care  Dr. Lacinda Axon   Health Maintenance, Male A healthy lifestyle and preventive care is important for your health and wellness. Ask your health care provider about what schedule of regular examinations is right for you. What should I know about weight and diet? Eat a Healthy Diet  Eat plenty of vegetables, fruits, whole grains, low-fat dairy products, and lean protein.  Do not eat a lot of foods high in solid fats, added sugars, or salt.  Maintain a Healthy Weight Regular exercise can help you achieve or maintain a healthy weight. You should:  Do at least 150 minutes of exercise each week. The exercise should increase your heart rate and make you sweat (moderate-intensity exercise).  Do strength-training exercises at least twice a week.  Watch Your Levels of Cholesterol and Blood Lipids  Have your blood tested for lipids and cholesterol every 5 years starting at 52 years of age. If you are at high risk for heart disease, you should start having your blood tested when you are 52 years old. You may need to have your cholesterol levels checked more often if: ? Your lipid or cholesterol levels are high. ? You are older than 52 years of age. ? You are at high risk for heart disease.  What should I know about cancer screening? Many types of cancers can be detected early and may often be prevented. Lung Cancer  You should be screened every year for lung cancer if: ? You are a current smoker who has smoked for at least 30 years. ? You are a former smoker who has quit within the past 15 years.  Talk to your health care provider about your screening options, when you should start screening, and how often you should be screened.  Colorectal Cancer  Routine colorectal cancer screening usually begins at 52 years of age and should be repeated every  5-10 years until you are 52 years old. You may need to be screened more often if early forms of precancerous polyps or small growths are found. Your health care provider may recommend screening at an earlier age if you have risk factors for colon cancer.  Your health care provider may recommend using home test kits to check for hidden blood in the stool.  A small camera at the end of a tube can be used to examine your colon (sigmoidoscopy or colonoscopy). This checks for the earliest forms of colorectal cancer.  Prostate and Testicular Cancer  Depending on your age and overall health, your health care provider may do certain tests to screen for prostate and testicular cancer.  Talk to your health care provider about any symptoms or concerns you have about testicular or prostate cancer.  Skin Cancer  Check your skin from head to toe regularly.  Tell your health care provider about any new moles or changes in moles, especially if: ? There is a change in a mole's size, shape, or color. ? You have a mole that is larger than a pencil eraser.  Always use sunscreen. Apply sunscreen liberally and repeat throughout the day.  Protect yourself by wearing long sleeves, pants, a wide-brimmed hat, and sunglasses when outside.  What should I know about heart disease, diabetes, and high blood pressure?  If you are 72-64 years of age, have your blood pressure checked every 3-5 years. If you are 40 years  of age or older, have your blood pressure checked every year. You should have your blood pressure measured twice-once when you are at a hospital or clinic, and once when you are not at a hospital or clinic. Record the average of the two measurements. To check your blood pressure when you are not at a hospital or clinic, you can use: ? An automated blood pressure machine at a pharmacy. ? A home blood pressure monitor.  Talk to your health care provider about your target blood pressure.  If you are  between 28-42 years old, ask your health care provider if you should take aspirin to prevent heart disease.  Have regular diabetes screenings by checking your fasting blood sugar level. ? If you are at a normal weight and have a low risk for diabetes, have this test once every three years after the age of 51. ? If you are overweight and have a high risk for diabetes, consider being tested at a younger age or more often.  A one-time screening for abdominal aortic aneurysm (AAA) by ultrasound is recommended for men aged 81-75 years who are current or former smokers. What should I know about preventing infection? Hepatitis B If you have a higher risk for hepatitis B, you should be screened for this virus. Talk with your health care provider to find out if you are at risk for hepatitis B infection. Hepatitis C Blood testing is recommended for:  Everyone born from [birth year] through 1966.  Anyone with known risk factors for hepatitis C.  Sexually Transmitted Diseases (STDs)  You should be screened each year for STDs including gonorrhea and chlamydia if: ? You are sexually active and are younger than 52 years of age. ? You are older than 52 years of age and your health care provider tells you that you are at risk for this type of infection. ? Your sexual activity has changed since you were last screened and you are at an increased risk for chlamydia or gonorrhea. Ask your health care provider if you are at risk.  Talk with your health care provider about whether you are at high risk of being infected with HIV. Your health care provider may recommend a prescription medicine to help prevent HIV infection.  What else can I do?  Schedule regular health, dental, and eye exams.  Stay current with your vaccines (immunizations).  Do not use any tobacco products, such as cigarettes, chewing tobacco, and e-cigarettes. If you need help quitting, ask your health care provider.  Limit alcohol intake to no  more than 2 drinks per day. One drink equals 12 ounces of beer, 5 ounces of wine, or 1 ounces of hard liquor.  Do not use street drugs.  Do not share needles.  Ask your health care provider for help if you need support or information about quitting drugs.  Tell your health care provider if you often feel depressed.  Tell your health care provider if you have ever been abused or do not feel safe at home. This information is not intended to replace advice given to you by your health care provider. Make sure you discuss any questions you have with your health care provider. Document Released: 06/17/2007 Document Revised: 08/18/2015 Document Reviewed: 09/22/2014 Elsevier Interactive Patient Education  [employer].

## 2016-06-23 ENCOUNTER — Ambulatory Visit: Payer: BLUE CROSS/BLUE SHIELD | Admitting: Family Medicine

## 2016-06-23 ENCOUNTER — Other Ambulatory Visit: Payer: Self-pay | Admitting: Family Medicine

## 2016-06-23 LAB — HIV ANTIBODY (ROUTINE TESTING W REFLEX): HIV 1&2 Ab, 4th Generation: NONREACTIVE

## 2016-06-23 MED ORDER — TAMSULOSIN HCL 0.4 MG PO CAPS: 0.4000 mg | ORAL_CAPSULE | Freq: Every day | ORAL | 3 refills | Status: DC

## 2016-06-23 MED ORDER — ROSUVASTATIN CALCIUM 10 MG PO TABS: 10.0000 mg | ORAL_TABLET | Freq: Every day | ORAL | 3 refills | Status: DC

## 2016-06-29 ENCOUNTER — Telehealth: Payer: Self-pay | Admitting: Family Medicine

## 2016-06-29 NOTE — Telephone Encounter (Signed)
Pt received lab results. He has some questions about results and when he should come back for a office visit. Please call him at [phone].

## 2016-06-29 NOTE — Telephone Encounter (Signed)
Patient wants to know what time frame would you like to see them.

## 2016-06-29 NOTE — Telephone Encounter (Signed)
6 months

## 2016-06-30 NOTE — Telephone Encounter (Signed)
Left voice mail to call back 

## 2016-06-30 NOTE — Telephone Encounter (Signed)
Appointment scheduled.

## 2016-09-05 ENCOUNTER — Telehealth: Payer: Self-pay | Admitting: Family Medicine

## 2016-09-05 NOTE — Telephone Encounter (Signed)
Copy of CD placed in folder upfront.

## 2016-09-05 NOTE — Telephone Encounter (Signed)
Pt states that he needs a copy of his xrays that were done in June for another doctors appt

## 2016-09-05 NOTE — Telephone Encounter (Signed)
Will forward message to [place]. I can not burn CD's on my computer.

## 2016-12-29 ENCOUNTER — Encounter: Payer: Self-pay | Admitting: Family Medicine

## 2016-12-29 ENCOUNTER — Ambulatory Visit (INDEPENDENT_AMBULATORY_CARE_PROVIDER_SITE_OTHER): Payer: BLUE CROSS/BLUE SHIELD | Admitting: Family Medicine

## 2016-12-29 ENCOUNTER — Other Ambulatory Visit: Payer: Self-pay

## 2016-12-29 ENCOUNTER — Ambulatory Visit: Payer: BLUE CROSS/BLUE SHIELD | Admitting: Family Medicine

## 2016-12-29 VITALS — BP 104/70 | HR 63 | Temp 97.9°F | Wt 175.2 lb

## 2016-12-29 DIAGNOSIS — N401 Enlarged prostate with lower urinary tract symptoms: Secondary | ICD-10-CM | POA: Diagnosis not present

## 2016-12-29 DIAGNOSIS — Z23 Encounter for immunization: Secondary | ICD-10-CM | POA: Diagnosis not present

## 2016-12-29 DIAGNOSIS — G8929 Other chronic pain: Secondary | ICD-10-CM | POA: Diagnosis not present

## 2016-12-29 DIAGNOSIS — E785 Hyperlipidemia, unspecified: Secondary | ICD-10-CM | POA: Diagnosis not present

## 2016-12-29 DIAGNOSIS — R351 Nocturia: Secondary | ICD-10-CM

## 2016-12-29 DIAGNOSIS — M545 Low back pain: Secondary | ICD-10-CM

## 2016-12-29 LAB — COMPREHENSIVE METABOLIC PANEL
ALT: 22 U/L (ref 0–53)
AST: 23 U/L (ref 0–37)
Albumin: 4.3 g/dL (ref 3.5–5.2)
Alkaline Phosphatase: 44 U/L (ref 39–117)
BUN: 17 mg/dL (ref 6–23)
CHLORIDE: 103 meq/L (ref 96–112)
CO2: 34 meq/L — AB (ref 19–32)
CREATININE: 0.85 mg/dL (ref 0.40–1.50)
Calcium: 9.7 mg/dL (ref 8.4–10.5)
GFR: 100.31 mL/min (ref >60.00)
GLUCOSE: 101 mg/dL — AB (ref 70–99)
Potassium: 4.3 mEq/L (ref 3.5–5.1)
SODIUM: 141 meq/L (ref 135–145)
Total Bilirubin: 0.6 mg/dL (ref 0.2–1.2)
Total Protein: 7.5 g/dL (ref 6.0–8.3)

## 2016-12-29 LAB — LIPID PANEL
CHOL/HDL RATIO: 3
Cholesterol: 145 mg/dL (ref 0–200)
HDL: 42.7 mg/dL (ref >39.00)
LDL CALC: 91 mg/dL (ref 0–99)
NONHDL: 102.14
TRIGLYCERIDES: 54 mg/dL (ref 0.0–149.0)
VLDL: 10.8 mg/dL (ref 0.0–40.0)

## 2016-12-29 MED ORDER — ROSUVASTATIN CALCIUM 10 MG PO TABS: 10.0000 mg | ORAL_TABLET | Freq: Every day | ORAL | 3 refills | Status: DC

## 2016-12-29 MED ORDER — TAMSULOSIN HCL 0.4 MG PO CAPS: 0.4000 mg | ORAL_CAPSULE | Freq: Every day | ORAL | 3 refills | Status: DC

## 2016-12-29 NOTE — Assessment & Plan Note (Signed)
Congratulated on weight loss.  We will continue to monitor his diet.  Continue Crestor.  Check lab work.

## 2016-12-29 NOTE — Patient Instructions (Signed)
Nice to see you. Please continue the Crestor and Flomax.  Please continue to work on diet and exercise. Please check with your insurance company regarding cologuard.  This will be sent to your house.

## 2016-12-29 NOTE — Assessment & Plan Note (Signed)
Chronic issues with this.  Has improved quite a bit with weight loss.  He will stay active and monitor.

## 2016-12-29 NOTE — Addendum Note (Signed)
Addended by: Juanda Chance on: 12/29/2016 11:46 AM   Modules accepted: Orders

## 2016-12-29 NOTE — Progress Notes (Signed)
Roger Rumps, MD Phone: [phone]  Roger Kim is a 51 y.o. male who presents today for follow-up.  Hyperlipidemia: Taking Crestor.  No chest pain or myalgias.  He has altered his diet significantly and is eating more fruits and vegetables.  He lost about 20 pounds doing this.  He does stay physically active.  BPH: Currently on Flomax.  This has helped significantly.  Notes his nocturia is down to 1-2 times a night.  No straining.  Rare starting and stopping.  Does fully empty his bladder.  This patient was placed on Celebrex for his back previously.  He notes he is no longer on this and his back does feel better.  He has a long history of DDD in his low back dating back to 52 years old.  Notes weight loss has helped with his back.  No numbness or weakness.  No incontinence.  Social History   Tobacco Use  Smoking Status Former Smoker  Smokeless Tobacco Never Used     ROS see history of present illness  Objective  Physical Exam Vitals:   12/29/16 1044  BP: 104/70  Pulse: 63  Temp: 97.9 F (36.6 C)  SpO2: 99%    BP Readings from Last 3 Encounters:  12/29/16 104/70  06/22/16 140/76  03/12/16 102/83   Wt Readings from Last 3 Encounters:  12/29/16 175 lb 3.2 oz (79.5 kg)  06/22/16 188 lb 3.2 oz (85.4 kg)  03/12/16 190 lb (86.2 kg)    Physical Exam  Constitutional: No distress.  Cardiovascular: Normal rate, regular rhythm and normal heart sounds.  Pulmonary/Chest: Effort normal and breath sounds normal.  Musculoskeletal: He exhibits no edema.  Neurological: He is alert. Gait normal.  Rises from chair on his own with no difficulty  Skin: Skin is warm and dry. He is not diaphoretic.     Assessment/Plan: Please see individual problem list.  BPH (benign prostatic hyperplasia) Improved.  PSA normal previously.  Continue Flomax.  Chronic low back pain Chronic issues with this.  Has improved quite a bit with weight loss.  He will stay active and  monitor.  Hyperlipidemia Congratulated on weight loss.  We will continue to monitor his diet.  Continue Crestor.  Check lab work.  Colon cancer screening: Cologuard will be set up for patient.  Advised the patient to contact his insurance company to make sure they cover this test.   Demir was seen today for establish care.  Diagnoses and all orders for this visit:  Hyperlipidemia, unspecified hyperlipidemia type -     Comp Met (CMET) -     Lipid panel  Benign prostatic hyperplasia with nocturia  Chronic left-sided low back pain without sciatica  Other orders -     rosuvastatin (CRESTOR) 10 MG tablet; Take 1 tablet (10 mg total) by mouth daily. -     tamsulosin (FLOMAX) 0.4 MG CAPS capsule; Take 1 capsule (0.4 mg total) by mouth daily.    Orders Placed This Encounter  Procedures  . Comp Met (CMET)  . Lipid panel    Meds ordered this encounter  Medications  . rosuvastatin (CRESTOR) 10 MG tablet    Sig: Take 1 tablet (10 mg total) by mouth daily.    Dispense:  90 tablet    Refill:  3  . tamsulosin (FLOMAX) 0.4 MG CAPS capsule    Sig: Take 1 capsule (0.4 mg total) by mouth daily.    Dispense:  90 capsule    Refill:  3  Roger Rumps, MD [place]

## 2016-12-29 NOTE — Assessment & Plan Note (Signed)
Improved.  PSA normal previously.  Continue Flomax.

## 2017-01-01 ENCOUNTER — Ambulatory Visit: Payer: BLUE CROSS/BLUE SHIELD | Admitting: Family Medicine

## 2017-02-18 ENCOUNTER — Other Ambulatory Visit: Payer: Self-pay

## 2017-02-18 ENCOUNTER — Ambulatory Visit
Admission: EM | Admit: 2017-02-18 | Discharge: 2017-02-18 | Disposition: A | Discharge status: Home / Self Care | Payer: BLUE CROSS/BLUE SHIELD | Attending: Emergency Medicine | Admitting: Emergency Medicine

## 2017-02-18 ENCOUNTER — Encounter: Payer: Self-pay | Admitting: Gynecology

## 2017-02-18 DIAGNOSIS — J0101 Acute recurrent maxillary sinusitis: Secondary | ICD-10-CM

## 2017-02-18 HISTORY — DX: Pure hypercholesterolemia, unspecified: E78.00

## 2017-02-18 HISTORY — DX: Other specified disorders of prostate: N42.89

## 2017-02-18 MED ORDER — DOXYCYCLINE HYCLATE 100 MG PO CAPS: 100.0000 mg | ORAL_CAPSULE | Freq: Two times a day (BID) | ORAL | 0 refills | Status: DC

## 2017-02-18 MED ORDER — FLUTICASONE PROPIONATE 50 MCG/ACT NA SUSP: 2.0000 | Freq: Every day | NASAL | 0 refills | Status: AC

## 2017-02-18 NOTE — ED Provider Notes (Signed)
[facility]    CSN: [id number] Arrival date & time: 02/18/17  1025     History   Chief Complaint Chief Complaint  Patient presents with  . Sinusitis    HPI Roger Kim is a 52 y.o. male.   52 yr old male pt presents to UC with cc of "sinus infection" x 1 week, getting worse. Feels exactly like when I get a sinus infection. +yellow mucous. Pt has report of yearly sinus infections,last was 11/2015.   The history is provided by the patient. No language interpreter was used.  Sinusitis  Pain details:    Location:  Maxillary and frontal   Duration:  1 week   Timing:  Constant Associated symptoms: congestion and cough   Associated symptoms: no fever     Past Medical History:  Diagnosis Date  . Allergic rhinitis   . Chicken pox   . Hypercholesteremia   . Prostate atrophy     Patient Active Problem List   Diagnosis Date Noted  . Hyperlipidemia 12/29/2016  . Chronic low back pain 06/22/2016  . BPH (benign prostatic hyperplasia) 06/22/2016    Past Surgical History:  Procedure Laterality Date  . thumb surgery         Home Medications    Prior to Admission medications   Medication Sig Start Date End Date Taking? Authorizing Provider  Ascorbic Acid (VITAMIN C) 1000 MG tablet Take 1,000 mg by mouth daily.   Yes [provider]  Multiple Vitamin (MULTIVITAMIN) capsule Take 1 capsule by mouth daily.   Yes [provider]  rosuvastatin (CRESTOR) 10 MG tablet Take 1 tablet (10 mg total) by mouth daily. 12/29/16  Yes Leone Haven, MD  tamsulosin (FLOMAX) 0.4 MG CAPS capsule Take 1 capsule (0.4 mg total) by mouth daily. 12/29/16  Yes Leone Haven, MD  doxycycline (VIBRAMYCIN) 100 MG capsule Take 1 capsule (100 mg total) by mouth 2 (two) times daily. 2/99/24   Roger Kim, Jeanett Schlein, NP  fluticasone (FLONASE) 50 MCG/ACT nasal spray Place 2 sprays into both nostrils daily. 2/68/34   Payton Moder, Jeanett Schlein, NP  ibuprofen (ADVIL,MOTRIN)  800 MG tablet Take 1 tablet (800 mg total) by mouth every 8 (eight) hours as needed. 11/03/15   Melynda Ripple, MD    Family History Family History  Problem Relation Age of Onset  . Alzheimer's disease Mother   . Cancer Father   . Arthritis Sister   . Breast cancer Sister     Social History Social History   Tobacco Use  . Smoking status: Former Research scientist (life sciences)  . Smokeless tobacco: Never Used  Substance Use Topics  . Alcohol use: No    Alcohol/week: 0.0 oz  . Drug use: No     Allergies   Penicillins   Review of Systems Review of Systems  Constitutional: Negative for fever.  HENT: Positive for congestion, sinus pressure and sinus pain.   Respiratory: Positive for cough.   All other systems reviewed and are negative.    Physical Exam Triage Vital Signs ED Triage Vitals  Enc Vitals Group     BP 02/18/17 1050 109/76     Pulse Rate 02/18/17 1050 62     Resp 02/18/17 1050 16     Temp 02/18/17 1050 97.8 F (36.6 C)     Temp Source 02/18/17 1050 Oral     SpO2 02/18/17 1050 98 %     Weight 02/18/17 1048 187 lb (84.8 kg)     Height 02/18/17 1048 5'  11" (1.803 m)     Head Circumference --      Peak Flow --      Pain Score 02/18/17 1048 3     Pain Loc --      Pain Edu? --      Excl. in [place]? --    No data found.  Updated Vital Signs BP 109/76 (BP Location: Left Arm)   Pulse 62   Temp 97.8 F (36.6 C) (Oral)   Resp 16   Ht 5\' 11"  (1.803 m)   Wt 187 lb (84.8 kg)   SpO2 98%   BMI 26.08 kg/m   Visual Acuity Right Eye Distance:   Left Eye Distance:   Bilateral Distance:    Right Eye Near:   Left Eye Near:    Bilateral Near:     Physical Exam  Constitutional: He is oriented to person, place, and time. He appears well-developed and well-nourished. He is active and cooperative.  Non-toxic appearance. He does not have a sickly appearance. He does not appear ill. No distress.  HENT:  Head: Normocephalic.  Right Ear: Tympanic membrane is retracted.  Left Ear:  Tympanic membrane is retracted.  Nose: Mucosal edema and sinus tenderness present. Right sinus exhibits maxillary sinus tenderness and frontal sinus tenderness. Left sinus exhibits maxillary sinus tenderness and frontal sinus tenderness.  Mouth/Throat: Uvula is midline, oropharynx is clear and moist and mucous membranes are normal.  Eyes: Conjunctivae, EOM and lids are normal. Pupils are equal, round, and reactive to light.  Neck: Normal range of motion.  Cardiovascular: Normal rate, regular rhythm, normal heart sounds and normal pulses.  Pulmonary/Chest: Effort normal and breath sounds normal. No respiratory distress. He has no decreased breath sounds. He has no wheezes.  Abdominal: Soft. He exhibits no distension.  Musculoskeletal: Normal range of motion.  Neurological: He is alert and oriented to person, place, and time. No cranial nerve deficit or sensory deficit. GCS eye subscore is 4. GCS verbal subscore is 5. GCS motor subscore is 6.  Skin: Skin is warm, dry and intact. No rash noted.  Psychiatric: He has a normal mood and affect. His speech is normal and behavior is normal.  Nursing note and vitals reviewed.    UC Treatments / Results  Labs (all labs ordered are listed, but only abnormal results are displayed) Labs Reviewed - No data to display  EKG  EKG Interpretation None       Radiology No results found.  Procedures Procedures (including critical care time)  Medications Ordered in UC Medications - No data to display   Initial Impression / Assessment and Plan / UC Course  I have reviewed the triage vital signs and the nursing notes.  Pertinent labs & imaging results that were available during my care of the patient were reviewed by me and considered in my medical decision making (see chart for details).    Rest,push fluids. Take meds as directed. Follow up with PCP. Return to UC as needed. Pt verbalized understanding to this provider.    Final Clinical  Impressions(s) / UC Diagnoses   Final diagnoses:  Acute recurrent maxillary sinusitis    ED Discharge Orders        Ordered    fluticasone (FLONASE) 50 MCG/ACT nasal spray  Daily     02/18/17 1130    doxycycline (VIBRAMYCIN) 100 MG capsule  2 times daily     02/18/17 1130       Controlled Substance Prescriptions    Jesseca Marsch,  Jeanett Schlein, NP 02/18/17 1740

## 2017-02-18 NOTE — ED Triage Notes (Signed)
Patient c/o sinus infection x 6 days. Per patient cough/ nasal drainage.

## 2017-02-18 NOTE — Discharge Instructions (Signed)
Rest,push fluids. Take meds as directed. Follow up with PCP. Return to UC as needed.

## 2017-02-21 ENCOUNTER — Telehealth: Payer: Self-pay

## 2017-02-21 NOTE — Telephone Encounter (Signed)
Called to follow up with patient since visit here at [facility]. Patient instructed to call back with any questions or concerns. MAH  

## 2017-03-20 ENCOUNTER — Encounter: Payer: Self-pay | Admitting: Emergency Medicine

## 2017-03-20 ENCOUNTER — Other Ambulatory Visit: Payer: Self-pay

## 2017-03-20 ENCOUNTER — Ambulatory Visit
Admission: EM | Admit: 2017-03-20 | Discharge: 2017-03-20 | Disposition: A | Discharge status: Home / Self Care | Payer: BLUE CROSS/BLUE SHIELD | Attending: Family Medicine | Admitting: Family Medicine

## 2017-03-20 DIAGNOSIS — J0110 Acute frontal sinusitis, unspecified: Secondary | ICD-10-CM | POA: Diagnosis not present

## 2017-03-20 DIAGNOSIS — R05 Cough: Secondary | ICD-10-CM

## 2017-03-20 DIAGNOSIS — J302 Other seasonal allergic rhinitis: Secondary | ICD-10-CM | POA: Diagnosis not present

## 2017-03-20 DIAGNOSIS — R059 Cough, unspecified: Secondary | ICD-10-CM

## 2017-03-20 DIAGNOSIS — J321 Chronic frontal sinusitis: Secondary | ICD-10-CM

## 2017-03-20 MED ORDER — BENZONATATE 100 MG PO CAPS: 100.0000 mg | ORAL_CAPSULE | Freq: Three times a day (TID) | ORAL | 0 refills | Status: DC

## 2017-03-20 MED ORDER — ALBUTEROL SULFATE HFA 108 (90 BASE) MCG/ACT IN AERS: 1.0000 | INHALATION_SPRAY | Freq: Four times a day (QID) | RESPIRATORY_TRACT | 0 refills | Status: DC | PRN

## 2017-03-20 MED ORDER — CETIRIZINE HCL 10 MG PO TABS: 10.0000 mg | ORAL_TABLET | Freq: Every day | ORAL | 0 refills | Status: DC

## 2017-03-20 MED ORDER — DOXYCYCLINE HYCLATE 100 MG PO CAPS: 100.0000 mg | ORAL_CAPSULE | Freq: Two times a day (BID) | ORAL | 0 refills | Status: DC

## 2017-03-20 NOTE — ED Triage Notes (Signed)
Patient in today c/o nasal congestion, productive cough(yellowish), sore throat and laryngitis x 6 days. Patient denies fever. Patient has tried OTC Mucinex DM, Dayquil/Nyquil and Flonase.

## 2017-03-20 NOTE — ED Provider Notes (Signed)
[facility]    CSN: [id number] Arrival date & time: 03/20/17  1159     History   Chief Complaint Chief Complaint  Patient presents with  . Nasal Congestion    HPI EL PILE is a 52 y.o. male.   Patient is a 52 year old male who presents with complaints of possible sinus infection with symptoms that started last Wednesday night.  Patient states he has been coughing up yellow/green/dark mucus and has been blowing the same thing out of his nose but the nose mucus occasionally have streaks of blood in it.  Patient also reports coughing fits a little burrow nausea as well as some chest discomfort.  Patient reports he did have a sore throat on Friday.  Patient is also reporting voice hoarseness but denies fever.  He has been taking Mucinex DM for several days which is brought up with a productive cough.  Patient does report he has been taking Flonase.  He is also been taking generic brands of DayQuil and NyQuil with minimal help.  He was seen here approximately month ago for sinusitis and treated with doxycycline he said which resolved his symptoms.      Past Medical History:  Diagnosis Date  . Allergic rhinitis   . Chicken pox   . Hypercholesteremia   . Prostate atrophy     Patient Active Problem List   Diagnosis Date Noted  . Hyperlipidemia 12/29/2016  . Chronic low back pain 06/22/2016  . BPH (benign prostatic hyperplasia) 06/22/2016    Past Surgical History:  Procedure Laterality Date  . thumb surgery         Home Medications    Prior to Admission medications   Medication Sig Start Date End Date Taking? Authorizing Provider  Ascorbic Acid (VITAMIN C) 1000 MG tablet Take 1,000 mg by mouth daily.   Yes [provider]  fluticasone (FLONASE) 50 MCG/ACT nasal spray Place 2 sprays into both nostrils daily. 1/44/31  Yes Defelice, Jeanett Schlein, NP  Multiple Vitamin (MULTIVITAMIN) capsule Take 1 capsule by mouth daily.   Yes [provider]    rosuvastatin (CRESTOR) 10 MG tablet Take 1 tablet (10 mg total) by mouth daily. 12/29/16  Yes Leone Haven, MD  tamsulosin (FLOMAX) 0.4 MG CAPS capsule Take 1 capsule (0.4 mg total) by mouth daily. 12/29/16  Yes Leone Haven, MD  albuterol (PROVENTIL HFA;VENTOLIN HFA) 108 (90 Base) MCG/ACT inhaler Inhale 1-2 puffs into the lungs every 6 (six) hours as needed for wheezing or shortness of breath. 03/20/17   Luvenia Redden, PA-C  benzonatate (TESSALON) 100 MG capsule Take 1 capsule (100 mg total) by mouth every 8 (eight) hours. 03/20/17   Luvenia Redden, PA-C  cetirizine (ZYRTEC) 10 MG tablet Take 1 tablet (10 mg total) by mouth daily. 03/20/17   Luvenia Redden, PA-C  doxycycline (VIBRAMYCIN) 100 MG capsule Take 1 capsule (100 mg total) by mouth 2 (two) times daily. 03/20/17   Luvenia Redden, PA-C    Family History Family History  Problem Relation Age of Onset  . Alzheimer's disease Mother   . Cancer Father   . Arthritis Sister   . Breast cancer Sister     Social History Social History   Tobacco Use  . Smoking status: Former Smoker    Last attempt to quit: 03/21/1978    Years since quitting: 39.0  . Smokeless tobacco: Never Used  Substance Use Topics  . Alcohol use: No    Alcohol/week: 0.0 oz  .  Drug use: No     Allergies   Penicillins   Review of Systems Review of Systems  As noted above in HPI.  Other systems reviewed and found to be negative   Physical Exam Triage Vital Signs ED Triage Vitals [03/20/17 1212]  Enc Vitals Group     BP 111/77     Pulse Rate 70     Resp 16     Temp 98.1 F (36.7 C)     Temp Source Oral     SpO2 99 %     Weight 187 lb (84.8 kg)     Height 5\' 11"  (1.803 m)     Head Circumference      Peak Flow      Pain Score 7     Pain Loc      Pain Edu?      Excl. in [place]?    No data found.  Updated Vital Signs BP 111/77 (BP Location: Left Arm)   Pulse 70   Temp 98.1 F (36.7 C) (Oral)   Resp 16   Ht 5\' 11"  (1.803 m)    Wt 187 lb (84.8 kg)   SpO2 99%   BMI 26.08 kg/m   Physical Exam  Constitutional: He is oriented to person, place, and time. He appears well-developed and well-nourished. No distress.  HENT:  Head: Normocephalic.  Right Ear: Ear canal normal.  Left Ear: Ear canal normal.  Nose: Mucosal edema present. Right sinus exhibits frontal sinus tenderness. Right sinus exhibits no maxillary sinus tenderness. Left sinus exhibits frontal sinus tenderness. Left sinus exhibits no maxillary sinus tenderness.  Mouth/Throat: Uvula is midline. Posterior oropharyngeal erythema present. Tonsils are 1+ on the right. Tonsils are 1+ on the left.  Voice soft and hoarse.  Some clear postnasal drainage.  Unable to visualize bilateral tympanic membranes due to cerumen  Cardiovascular: Normal rate and regular rhythm.  No murmur heard. Pulmonary/Chest: Effort normal. He has no wheezes.  Cough with forced expiration  Musculoskeletal: Normal range of motion. He exhibits no edema or deformity.  Neurological: He is alert and oriented to person, place, and time. No cranial nerve deficit.  Skin: Skin is warm and dry.  Psychiatric: He has a normal mood and affect. His behavior is normal.     UC Treatments / Results  Labs (all labs ordered are listed, but only abnormal results are displayed) Labs Reviewed - No data to display  EKG  EKG Interpretation None       Radiology No results found.  Procedures Procedures (including critical care time)  Medications Ordered in UC Medications - No data to display   Initial Impression / Assessment and Plan / UC Course  I have reviewed the triage vital signs and the nursing notes.  Pertinent labs & imaging results that were available during my care of the patient were reviewed by me and considered in my medical decision making (see chart for details).     Patient with signs and symptoms of sinusitis as well as some reactive airway given his cough with forced  expiration.  Give him prescription for doxycycline since it worked for him in the past.  Also give him albuterol inhaler for his cough and shortness of breath.  Also give him Tessalon for his cough.  Also add Zyrtec, advising patient to take it at night and continue with his Flonase in the morning.  Final Clinical Impressions(s) / UC Diagnoses   Final diagnoses:  Seasonal allergies  Chronic frontal sinusitis  Cough    ED Discharge Orders        Ordered    albuterol (PROVENTIL HFA;VENTOLIN HFA) 108 (90 Base) MCG/ACT inhaler  Every 6 hours PRN     03/20/17 1257    cetirizine (ZYRTEC) 10 MG tablet  Daily     03/20/17 1257    doxycycline (VIBRAMYCIN) 100 MG capsule  2 times daily     03/20/17 1257    benzonatate (TESSALON) 100 MG capsule  Every 8 hours     03/20/17 1258       Controlled Substance Prescriptions [place] Controlled Substance Registry consulted? Not Applicable   Luvenia Redden, PA-C 03/20/17 1259

## 2017-03-20 NOTE — Discharge Instructions (Signed)
-  Doxycycline: 1 tablet twice a day for 10 days -Zyrtec 1 tablet nightly.  Continue Flonase in the morning.  Prescription for Zyrtec given but may also obtain over-the-counter -Albuterol inhaler: 1-2 puffs every 6 hours as needed for cough/shortness of breath. -Tessalon: 1 tablet every 8 hours as needed for cough -Push fluids -Ibuprofen or Tylenol as needed for pain -Follow with primary care provider as needed.

## 2017-04-09 ENCOUNTER — Ambulatory Visit
Admission: EM | Admit: 2017-04-09 | Discharge: 2017-04-09 | Disposition: A | Discharge status: Home / Self Care | Payer: BLUE CROSS/BLUE SHIELD | Attending: Family Medicine | Admitting: Family Medicine

## 2017-04-09 ENCOUNTER — Other Ambulatory Visit: Payer: Self-pay

## 2017-04-09 DIAGNOSIS — S39012A Strain of muscle, fascia and tendon of lower back, initial encounter: Secondary | ICD-10-CM | POA: Diagnosis not present

## 2017-04-09 DIAGNOSIS — M545 Low back pain, unspecified: Secondary | ICD-10-CM

## 2017-04-09 MED ORDER — CYCLOBENZAPRINE HCL 10 MG PO TABS: 10.0000 mg | ORAL_TABLET | Freq: Two times a day (BID) | ORAL | 0 refills | Status: DC | PRN

## 2017-04-09 MED ORDER — MELOXICAM 15 MG PO TABS: 15.0000 mg | ORAL_TABLET | Freq: Every day | ORAL | 0 refills | Status: DC | PRN

## 2017-04-09 MED ORDER — KETOROLAC TROMETHAMINE 60 MG/2ML IM SOLN
60.0000 mg | Freq: Once | INTRAMUSCULAR | Status: AC
  Administered 2017-04-09: 60 mg via INTRAMUSCULAR

## 2017-04-09 NOTE — Discharge Instructions (Addendum)
Take medication as prescribed. Rest. Drink plenty of fluids. Avoid over strenuous activity. Stretch.   Follow up with your primary care physician this week as needed. Return to Urgent care for new or worsening concerns.

## 2017-04-09 NOTE — ED Provider Notes (Signed)
[facility] ____________________________________________  Time seen: Approximately 10:00 AM  I have reviewed the triage vital signs and the nursing notes.   HISTORY  Chief Complaint Back Pain   HPI Roger Kim is a 52 y.o. male presented for evaluation of low back pain that is been present since Saturday.  States he has a history of degenerative disc disease, and intermittently has back pain.  States he feels like he overdid and at work last week as his job includes a lot of bending, twisting and lifting, and states that he was feeling some low back tension.  States that this past Saturday he was cleaning his motorcycle and doing this he feels like he again overdid it and had gradual onset of low back pain.  Denies any fall, direct injury or abrupt onset.  Denies any onset during lifting.  States pain is all the way across the low back and worse with movement.  Denies pain radiation, paresthesias, urinary bowel retention or incontinence, fevers, abdominal pain, chest pain, shortness of breath, extremity pain or swelling.  Denies rash.  States has taken occasional over-the-counter Aleve without much change.  States last Aleve dose was 2 AM this morning.  States pain currently is sitting still is mild, moderate with movement.  States that movement is worse with bending as well as going from sitting to standing. Denies recent sickness. Denies recent antibiotic use.  Denies cardiac history.  Denies renal insufficiency.  Leone Haven, MD: PCP   Past Medical History:  Diagnosis Date  . Allergic rhinitis   . Chicken pox   . Hypercholesteremia   . Prostate atrophy     Patient Active Problem List   Diagnosis Date Noted  . Hyperlipidemia 12/29/2016  . Chronic low back pain 06/22/2016  . BPH (benign prostatic hyperplasia) 06/22/2016    Past Surgical History:  Procedure Laterality Date  . thumb surgery       No current facility-administered medications for this  encounter.   Current Outpatient Medications:  .  albuterol (PROVENTIL HFA;VENTOLIN HFA) 108 (90 Base) MCG/ACT inhaler, Inhale 1-2 puffs into the lungs every 6 (six) hours as needed for wheezing or shortness of breath., Disp: 1 Inhaler, Rfl: 0 .  Ascorbic Acid (VITAMIN C) 1000 MG tablet, Take 1,000 mg by mouth daily., Disp: , Rfl:  .  cetirizine (ZYRTEC) 10 MG tablet, Take 1 tablet (10 mg total) by mouth daily., Disp: 30 tablet, Rfl: 0 .  fluticasone (FLONASE) 50 MCG/ACT nasal spray, Place 2 sprays into both nostrils daily., Disp: 16 g, Rfl: 0 .  Multiple Vitamin (MULTIVITAMIN) capsule, Take 1 capsule by mouth daily., Disp: , Rfl:  .  rosuvastatin (CRESTOR) 10 MG tablet, Take 1 tablet (10 mg total) by mouth daily., Disp: 90 tablet, Rfl: 3 .  tamsulosin (FLOMAX) 0.4 MG CAPS capsule, Take 1 capsule (0.4 mg total) by mouth daily., Disp: 90 capsule, Rfl: 3 .  cyclobenzaprine (FLEXERIL) 10 MG tablet, Take 1 tablet (10 mg total) by mouth 2 (two) times daily as needed for muscle spasms. Do not drive while taking as can cause drowsiness, Disp: 15 tablet, Rfl: 0 .  meloxicam (MOBIC) 15 MG tablet, Take 1 tablet (15 mg total) by mouth daily as needed., Disp: 10 tablet, Rfl: 0  Allergies Penicillins  Family History  Problem Relation Age of Onset  . Alzheimer's disease Mother   . Cancer Father   . Arthritis Sister   . Breast cancer Sister     Social History Social  History   Tobacco Use  . Smoking status: Former Smoker    Last attempt to quit: 03/21/1978    Years since quitting: 39.0  . Smokeless tobacco: Never Used  Substance Use Topics  . Alcohol use: No    Alcohol/week: 0.0 oz  . Drug use: No    Review of Systems Constitutional: No fever/chills Cardiovascular: Denies chest pain. Respiratory: Denies shortness of breath. Gastrointestinal: No abdominal pain.  No nausea, no vomiting.  No diarrhea.  No constipation. Genitourinary: Negative for dysuria. Musculoskeletal: Positive for back  pain. Skin: Negative for rash.   ____________________________________________   PHYSICAL EXAM:  VITAL SIGNS: ED Triage Vitals  Enc Vitals Group     BP 04/09/17 0900 109/79     Pulse Rate 04/09/17 0900 (!) 57     Resp 04/09/17 0900 18     Temp 04/09/17 0900 97.7 F (36.5 C)     Temp Source 04/09/17 0900 Oral     SpO2 04/09/17 0900 100 %     Weight 04/09/17 0858 187 lb (84.8 kg)     Height 04/09/17 0858 5\' 11"  (1.803 m)     Head Circumference --      Peak Flow --      Pain Score 04/09/17 0858 10     Pain Loc --      Pain Edu? --      Excl. in [place]? --     Constitutional: Alert and oriented. Well appearing and in no acute distress. Cardiovascular: Normal rate, regular rhythm. Grossly normal heart sounds.  Good peripheral circulation. Respiratory: Normal respiratory effort without tachypnea nor retractions. Breath sounds are clear and equal bilaterally. No wheezes, rales, rhonchi. Gastrointestinal: Soft and nontender.No CVA tenderness. Musculoskeletal:  No midline cervical, thoracic or lumbar tenderness to palpation. Bilateral pedal pulses equal and easily palpated.      Right lower leg:  No tenderness or edema.      Left lower leg:  No tenderness or edema.  Except: Very mild diffuse lumbar tenderness palpation, no point tenderness, no point bony tenderness, no swelling, no ecchymosis, no rash, pain increases with lumbar flexion extension and lumbar flexion limited, pain also increased with right and left lumbar rotation, no pain with standing bilateral knee lifts, bilateral plantar flexion and dorsiflexion strong and equal, no saddle anesthesia.  Steady gait. Neurologic:  Normal speech and language. No gross focal neurologic deficits are appreciated. Speech is normal. No gait instability.  Skin:  Skin is warm, dry and intact. No rash noted. Psychiatric: Mood and affect are normal. Speech and behavior are normal. Patient exhibits appropriate insight and judgment     ___________________________________________   LABS (all labs ordered are listed, but only abnormal results are displayed)  Labs Reviewed - No data to display ____________________________________________   PROCEDURES Procedures    INITIAL IMPRESSION / ASSESSMENT AND PLAN / ED COURSE  Pertinent labs & imaging results that were available during my care of the patient were reviewed by me and considered in my medical decision making (see chart for details).  Well-appearing patient.  No acute distress.  Presenting for evaluation of low back pain.  Denies direct trauma.  States a lot of repetitive movements in the last week and felt like he "overdid it ".  Suspect lumbar strain.  Toradol 60 mg IM given once in urgent care.  Will treat at home with daily Mobic and as needed muscle relaxant.  Encouraged rest, fluids, stretching and supportive care.Discussed indication, risks and benefits of medications  with patient.  Discussed follow up with Primary care physician this week. Discussed follow up and return parameters including no resolution or any worsening concerns. Patient verbalized understanding and agreed to plan.   ____________________________________________   FINAL CLINICAL IMPRESSION(S) / ED DIAGNOSES  Final diagnoses:  Acute bilateral low back pain without sciatica  Strain of lumbar region, initial encounter     ED Discharge Orders        Ordered    meloxicam (MOBIC) 15 MG tablet  Daily PRN     04/09/17 1010    cyclobenzaprine (FLEXERIL) 10 MG tablet  2 times daily PRN     04/09/17 1010       Note: This dictation was prepared with Dragon dictation along with smaller phrase technology. Any transcriptional errors that result from this process are unintentional.         Marylene Land, NP 04/09/17 1031

## 2017-04-09 NOTE — ED Triage Notes (Signed)
Patient complains of low back pain that started on Saturday after working on his harley. Patient reports that pain worsens when he sits for an extended amount of time and then tries to walk. Patient states that if he moves a certain way the pain takes his breath away.

## 2017-06-22 ENCOUNTER — Encounter: Payer: Self-pay | Admitting: Family Medicine

## 2017-06-22 ENCOUNTER — Ambulatory Visit (INDEPENDENT_AMBULATORY_CARE_PROVIDER_SITE_OTHER): Payer: BLUE CROSS/BLUE SHIELD | Admitting: Family Medicine

## 2017-06-22 DIAGNOSIS — R351 Nocturia: Secondary | ICD-10-CM | POA: Diagnosis not present

## 2017-06-22 DIAGNOSIS — M545 Low back pain, unspecified: Secondary | ICD-10-CM

## 2017-06-22 DIAGNOSIS — N401 Enlarged prostate with lower urinary tract symptoms: Secondary | ICD-10-CM

## 2017-06-22 DIAGNOSIS — G8929 Other chronic pain: Secondary | ICD-10-CM | POA: Diagnosis not present

## 2017-06-22 DIAGNOSIS — D179 Benign lipomatous neoplasm, unspecified: Secondary | ICD-10-CM | POA: Insufficient documentation

## 2017-06-22 DIAGNOSIS — R7303 Prediabetes: Secondary | ICD-10-CM | POA: Diagnosis not present

## 2017-06-22 DIAGNOSIS — D1722 Benign lipomatous neoplasm of skin and subcutaneous tissue of left arm: Secondary | ICD-10-CM

## 2017-06-22 DIAGNOSIS — E785 Hyperlipidemia, unspecified: Secondary | ICD-10-CM
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
## 2017-06-22 NOTE — Assessment & Plan Note (Signed)
Refer to general surgery.

## 2017-06-22 NOTE — Assessment & Plan Note (Signed)
Well-controlled.  Continue current regimen. 

## 2017-06-22 NOTE — Assessment & Plan Note (Signed)
Continue current medication.

## 2017-06-22 NOTE — Progress Notes (Signed)
  Tommi Rumps, MD Phone: [phone]  DELONTE MUSICH is a 52 y.o. male who presents today for f/u.  CC: hld, bph, chronic back pain  HYPERLIPIDEMIA Symptoms Chest pain on exertion:  no    Medications: Compliance- taking crestor Right upper quadrant pain- no  Muscle aches- no  BPH: Strain- no Flow- no issues Frequency- no Urgency- no Nocturia- 1x Dysuria- no Emptying bladder- no issues Medication- taking flomax  Chronic back pain: Notes chronic upper, mid, and lower back pain.  In the past it has radiated to his legs though not recently.  No incontinence or numbness or weakness.  He typically takes ibuprofen.  He reports on 3 occasions throughout his life he has had sharp chest discomfort that will last for a minute go away on its own.  It has not occurred anytime recently.  Lipomas: Notes he has them in his left arm and on his left side.  His family has a history of them.  He wants to see a surgeon to have them removed.    Social History   Tobacco Use  Smoking Status Former Smoker  . Last attempt to quit: 03/21/1978  . Years since quitting: 39.2  Smokeless Tobacco Never Used     ROS see history of present illness  Objective  Physical Exam Vitals:   06/22/17 1559  BP: 108/60  Pulse: 63  Temp: 98.3 F (36.8 C)  SpO2: 98%    BP Readings from Last 3 Encounters:  06/22/17 108/60  04/09/17 109/79  03/20/17 111/77   Wt Readings from Last 3 Encounters:  06/22/17 177 lb (80.3 kg)  04/09/17 187 lb (84.8 kg)  03/20/17 187 lb (84.8 kg)    Physical Exam  Constitutional: No distress.  Cardiovascular: Normal rate, regular rhythm and normal heart sounds.  Pulmonary/Chest: Effort normal and breath sounds normal.  Musculoskeletal: He exhibits no edema.  Several soft tissue fatty tumors noted in his left forearm, one noted noted on his left flank  Neurological: He is alert.  Skin: Skin is warm and dry. He is not diaphoretic.     Assessment/Plan: Please  see individual problem list.  BPH (benign prostatic hyperplasia) Well-controlled.  Continue current regimen.  Chronic low back pain Chronic issue.  The fire alarm went off prior to being able to complete an evaluation.  There were no acute changes in the history.  Patient left before any paperwork to be given to him.  Prediabetes Due for A1c.  He will have to  return for this.  Lipoma Refer to general surgery.   Hyperlipidemia Continue current medication.   Orders Placed This Encounter  Procedures  . Ambulatory referral to General Surgery    Referral Priority:   Routine    Referral Type:   Surgical    Referral Reason:   Specialty Services Required    Requested Specialty:   General Surgery    Number of Visits Requested:   1  . POCT HgB A1C    Standing Status:   Future    Standing Expiration Date:   08/22/2017    No orders of the defined types were placed in this encounter.    Tommi Rumps, MD [place]

## 2017-06-22 NOTE — Assessment & Plan Note (Addendum)
Due for A1c.  He will have to  return for this.

## 2017-06-25 ENCOUNTER — Encounter: Payer: Self-pay | Admitting: General Surgery

## 2017-06-25 NOTE — Progress Notes (Signed)
Patient states he will call back to schedule

## 2017-07-16 ENCOUNTER — Telehealth: Payer: Self-pay

## 2017-07-16 NOTE — Telephone Encounter (Signed)
Copied from [place] [id number]. Topic: Bill or Statement - Patient/Guarantor Inquiry >> Jul 16, 2017  2:34 PM Tye Maryland wrote: Pt called and states he has a $240 bill for a visit on the 21st of June and nothing was done in the visit, contact pt to advise; pt states he has contacted billing

## 2017-07-17 NOTE — Telephone Encounter (Signed)
Billing is who he should contact.

## 2017-07-18 ENCOUNTER — Telehealth: Payer: Self-pay

## 2017-07-18 NOTE — Telephone Encounter (Signed)
Patient was seen and charged accordingly per provider.

## 2017-07-18 NOTE — Telephone Encounter (Signed)
Copied from [place] [id number]. Topic: Bill or Statement - Patient/Guarantor Inquiry >> Jul 16, 2017  2:34 PM Tye Maryland wrote: Pt called and states he has a $240 bill for a visit on the 21st of June and nothing was done in the visit, contact pt to advise; pt states he has contacted billing

## 2017-07-18 NOTE — Telephone Encounter (Signed)
Copied from [place] [id number]. Topic: Bill or Statement - Patient/Guarantor Inquiry >> Jul 16, 2017  2:34 PM Tye Maryland wrote: Pt called and states he has a $240 bill for a visit on the 21st of June and nothing was done in the visit, contact pt to advise; pt states he has contacted billing

## 2017-07-23 ENCOUNTER — Encounter: Payer: Self-pay | Admitting: *Deleted

## 2017-07-26 ENCOUNTER — Encounter: Payer: Self-pay | Admitting: General Surgery

## 2017-07-26 ENCOUNTER — Ambulatory Visit (INDEPENDENT_AMBULATORY_CARE_PROVIDER_SITE_OTHER): Payer: BLUE CROSS/BLUE SHIELD | Admitting: General Surgery

## 2017-07-26 VITALS — BP 130/80 | HR 60 | Resp 12 | Ht 70.0 in | Wt 172.0 lb

## 2017-07-26 DIAGNOSIS — D171 Benign lipomatous neoplasm of skin and subcutaneous tissue of trunk: Secondary | ICD-10-CM

## 2017-07-26 DIAGNOSIS — D1722 Benign lipomatous neoplasm of skin and subcutaneous tissue of left arm: Secondary | ICD-10-CM | POA: Diagnosis not present

## 2017-07-26 NOTE — Patient Instructions (Signed)
Lipoma A lipoma is a noncancerous (benign) tumor that is made up of fat cells. This is a very common type of soft-tissue growth. Lipomas are usually found under the skin (subcutaneous). They may occur in any tissue of the body that contains fat. Common areas for lipomas to appear include the back, shoulders, buttocks, and thighs. Lipomas grow slowly, and they are usually painless. Most lipomas do not cause problems and do not require treatment. What are the causes? The cause of this condition is not known. What increases the risk? This condition is more likely to develop in:  People who are 40-60 years old.  People who have a family history of lipomas.  What are the signs or symptoms? A lipoma usually appears as a small, round bump under the skin. It may feel soft or rubbery, but the firmness can vary. Most lipomas are not painful. However, a lipoma may become painful if it is located in an area where it pushes on nerves. How is this diagnosed? A lipoma can usually be diagnosed with a physical exam. You may also have tests to confirm the diagnosis and to rule out other conditions. Tests may include:  Imaging tests, such as a CT scan or MRI.  Removal of a tissue sample to be looked at under a microscope (biopsy).  How is this treated? Treatment is not needed for small lipomas that are not causing problems. If a lipoma continues to get bigger or it causes problems, removal is often the best option. Lipomas can also be removed to improve appearance. Removal of a lipoma is usually done with a surgery in which the fatty cells and the surrounding capsule are removed. Most often, a medicine that numbs the area (local anesthetic) is used for this procedure. Follow these instructions at home:  Keep all follow-up visits as directed by your health care provider. This is important. Contact a health care provider if:  Your lipoma becomes larger or hard.  Your lipoma becomes painful, red, or  increasingly swollen. These could be signs of infection or a more serious condition. This information is not intended to replace advice given to you by your health care provider. Make sure you discuss any questions you have with your health care provider. Document Released: 12/09/2001 Document Revised: 05/27/2015 Document Reviewed: 12/15/2013 Elsevier Interactive Patient Education  2018 Elsevier Inc.  

## 2017-07-26 NOTE — Progress Notes (Signed)
Patient ID: Roger Kim, male   DOB: 12-25-1965, 52 y.o.   MRN: [id number]  Chief Complaint  Patient presents with  . Lipoma    HPI Roger Kim is a 52 y.o. male here today for a evaluation of lipoma on left forearm. Patient states he noticed this area about 4 years ago. Some change in size but no pain. He got on on his left side that give him some discomfort.  HPI  Past Medical History:  Diagnosis Date  . Allergic rhinitis   . Chicken pox   . Hypercholesteremia   . Prostate atrophy     Past Surgical History:  Procedure Laterality Date  . thumb surgery      Family History  Problem Relation Age of Onset  . Alzheimer's disease Mother   . Cancer Father   . Arthritis Sister   . Breast cancer Sister     Social History Social History   Tobacco Use  . Smoking status: Former Smoker    Last attempt to quit: 03/21/1978    Years since quitting: 39.3  . Smokeless tobacco: Never Used  Substance Use Topics  . Alcohol use: No    Alcohol/week: 0.0 oz  . Drug use: No    Allergies  Allergen Reactions  . Penicillins     Current Outpatient Medications  Medication Sig Dispense Refill  . Ascorbic Acid (VITAMIN C) 1000 MG tablet Take 1,000 mg by mouth daily.    . cetirizine (ZYRTEC) 10 MG tablet Take 1 tablet (10 mg total) by mouth daily. 30 tablet 0  . fluticasone (FLONASE) 50 MCG/ACT nasal spray Place 2 sprays into both nostrils daily. 16 g 0  . rosuvastatin (CRESTOR) 10 MG tablet Take 1 tablet (10 mg total) by mouth daily. 90 tablet 3  . tamsulosin (FLOMAX) 0.4 MG CAPS capsule Take 1 capsule (0.4 mg total) by mouth daily. 90 capsule 3  . Multiple Vitamin (MULTIVITAMIN) capsule Take 1 capsule by mouth daily.     No current facility-administered medications for this visit.     Review of Systems Review of Systems  Constitutional: Negative.   Respiratory: Negative.   Cardiovascular: Negative.     Blood pressure 130/80, pulse 60, resp. rate 12, height 5\' 10"  (1.778  m), weight 172 lb (78 kg).  Physical Exam Physical Exam  Constitutional: He is oriented to person, place, and time. He appears well-developed and well-nourished.  Abdominal:    Musculoskeletal:       Arms: Neurological: He is alert and oriented to person, place, and time.  Skin: Skin is warm and dry.   Left postoral axilla line  6 mm lipoma.        Assessment    Symptomatic lipomas of the left forearm and to a lesser extent the left flank.    Plan  Patient to return for 3 lipoma left forearm.     HPI, Physical Exam, Assessment and Plan have been scribed under the direction and in the presence of Hervey Ard, MD.  Gaspar Cola, CMA  I have completed the exam and reviewed the above documentation for accuracy and completeness.  I agree with the above.  Haematologist has been used and any errors in dictation or transcription are unintentional.  Hervey Ard, M.D., F.A.C.S.   Lio Wehrly Antinio Sanderfer 07/27/2017, 9:18 PM

## 2017-07-27 DIAGNOSIS — D171 Benign lipomatous neoplasm of skin and subcutaneous tissue of trunk: Secondary | ICD-10-CM | POA: Insufficient documentation

## 2017-07-27 DIAGNOSIS — D1722 Benign lipomatous neoplasm of skin and subcutaneous tissue of left arm: Secondary | ICD-10-CM | POA: Insufficient documentation

## 2017-08-23 ENCOUNTER — Encounter: Payer: Self-pay | Admitting: General Surgery

## 2017-08-23 ENCOUNTER — Ambulatory Visit (INDEPENDENT_AMBULATORY_CARE_PROVIDER_SITE_OTHER): Payer: BLUE CROSS/BLUE SHIELD | Admitting: General Surgery

## 2017-08-23 VITALS — Ht 71.0 in | Wt 175.0 lb

## 2017-08-23 DIAGNOSIS — D1722 Benign lipomatous neoplasm of skin and subcutaneous tissue of left arm: Secondary | ICD-10-CM

## 2017-08-23 NOTE — Patient Instructions (Addendum)
The patient is aware to call back for any questions or concerns.  May shower May remove dressing in 2-3 days Steri strips will gradually come off over 2-3 weeks Compression wrap can be removed tomorrow May use an Ice pack as needed for comfort

## 2017-08-23 NOTE — Progress Notes (Signed)
Patient ID: Roger Kim, male   DOB: 1965-08-12, 52 y.o.   MRN: [id number]  Chief Complaint  Patient presents with  . Procedure    HPI Roger Kim is a 52 y.o. male here today for excision left forearm lipoma.  HPI  Past Medical History:  Diagnosis Date  . Allergic rhinitis   . Chicken pox   . Hypercholesteremia   . Prostate atrophy     Past Surgical History:  Procedure Laterality Date  . thumb surgery      Family History  Problem Relation Age of Onset  . Alzheimer's disease Mother   . Cancer Father   . Arthritis Sister   . Breast cancer Sister     Social History Social History   Tobacco Use  . Smoking status: Former Smoker    Last attempt to quit: 03/21/1978    Years since quitting: 39.4  . Smokeless tobacco: Never Used  Substance Use Topics  . Alcohol use: No    Alcohol/week: 0.0 standard drinks  . Drug use: No    Allergies  Allergen Reactions  . Penicillins     Current Outpatient Medications  Medication Sig Dispense Refill  . Ascorbic Acid (VITAMIN C) 1000 MG tablet Take 1,000 mg by mouth daily.    . fluticasone (FLONASE) 50 MCG/ACT nasal spray Place 2 sprays into both nostrils daily. 16 g 0  . Multiple Vitamin (MULTIVITAMIN) capsule Take 1 capsule by mouth daily.    . rosuvastatin (CRESTOR) 10 MG tablet Take 1 tablet (10 mg total) by mouth daily. 90 tablet 3  . tamsulosin (FLOMAX) 0.4 MG CAPS capsule Take 1 capsule (0.4 mg total) by mouth daily. 90 capsule 3   No current facility-administered medications for this visit.     Review of Systems Review of Systems  Constitutional: Negative.   Respiratory: Negative.   Cardiovascular: Negative.     Height 5\' 11"  (1.803 m), weight 175 lb (79.4 kg).  Physical Exam Physical Exam  Musculoskeletal:       Arms:   Data Reviewed 15 cc of 0.5% Xylocaine with 0.25% Marcaine with 1 to 200,000 units of epinephrine was utilized and well-tolerated.  ChloraPrep was applied to the skin.  The lesions  were approached through a longitudinal incision.  Skin and soft tissue were divided sharply.  Each lipoma was then freed with blunt dissection.  No bleeding was noted.  The skin defect on the larger lesions was closed with a running 4-0 Vicryl subcuticular suture.  A single bleeding vessel in the most superior site was controlled with a 4-0 Vicryl tie.  The smaller lesion between the 2 dominant lipomas was closed with a simple 4-0 subcuticular suture.  Benzoin Steri-Strips followed by Telfa and Tegaderm dressings were applied.  A soft wrap followed by Coban dressing was applied.  Outer dressing to be removed in 24 hours.  Tegaderms to be removed in 3 days.  Assessment    Multiple lipomas of the left forearm.    Plan  The patient has a 4-5 cm lipoma on the left lateral chest wall.  If he desires he can return for excision of this lesion at his convenience.  HPI, Physical Exam, Assessment and Plan have been scribed under the direction and in the presence of Hervey Ard, MD.  Gaspar Cola, CMA  I have completed the exam and reviewed the above documentation for accuracy and completeness.  I agree with the above.  Haematologist has been used and any errors in  dictation or transcription are unintentional.  Hervey Ard, M.D., F.A.C.S.   Roger Kim Roger Kim 08/23/2017, 6:10 PM

## 2017-08-29 ENCOUNTER — Telehealth: Payer: Self-pay | Admitting: *Deleted

## 2017-08-29 NOTE — Telephone Encounter (Signed)
Notified patient as instructed, patient pleased. Discussed follow-up appointments, patient agrees  

## 2017-08-29 NOTE — Telephone Encounter (Signed)
-----   Message from Robert Bellow, MD sent at 08/29/2017  1:55 PM EDT ----- These notify the patient that the 3 lesions removed from his left arm were all fatty growths as expected. ----- Message ----- From: Interface, Lab In [facility] Sent: 08/29/2017  11:54 AM EDT To: Robert Bellow, MD

## 2017-10-09 ENCOUNTER — Ambulatory Visit (INDEPENDENT_AMBULATORY_CARE_PROVIDER_SITE_OTHER): Payer: BLUE CROSS/BLUE SHIELD | Admitting: General Surgery

## 2017-10-09 ENCOUNTER — Encounter: Payer: Self-pay | Admitting: General Surgery

## 2017-10-09 VITALS — BP 100/58 | HR 55 | Resp 11 | Ht 71.0 in | Wt 176.0 lb

## 2017-10-09 DIAGNOSIS — D171 Benign lipomatous neoplasm of skin and subcutaneous tissue of trunk: Secondary | ICD-10-CM | POA: Diagnosis not present

## 2017-10-09 NOTE — Patient Instructions (Signed)
May shower May remove dressing in 2-3 days Steri strips will gradually come off over 2-3 weeks May use an Ice pack as needed for comfort  

## 2017-10-09 NOTE — Progress Notes (Signed)
Patient ID: Roger Kim, male   DOB: 1965/06/25, 52 y.o.   MRN: [id number]  Chief Complaint  Patient presents with  . Procedure    HPI KHALEL ALMS is a 52 y.o. male.  Here for excision left lateral chest wall lipoma.   HPI  Past Medical History:  Diagnosis Date  . Allergic rhinitis   . Chicken pox   . Hypercholesteremia   . Prostate atrophy     Past Surgical History:  Procedure Laterality Date  . thumb surgery      Family History  Problem Relation Age of Onset  . Alzheimer's disease Mother   . Cancer Father   . Arthritis Sister   . Breast cancer Sister     Social History Social History   Tobacco Use  . Smoking status: Former Smoker    Last attempt to quit: 03/21/1978    Years since quitting: 39.5  . Smokeless tobacco: Never Used  Substance Use Topics  . Alcohol use: No    Alcohol/week: 0.0 standard drinks  . Drug use: No    Allergies  Allergen Reactions  . Penicillins     Current Outpatient Medications  Medication Sig Dispense Refill  . Ascorbic Acid (VITAMIN C) 1000 MG tablet Take 1,000 mg by mouth daily.    . fluticasone (FLONASE) 50 MCG/ACT nasal spray Place 2 sprays into both nostrils daily. 16 g 0  . Multiple Vitamin (MULTIVITAMIN) capsule Take 1 capsule by mouth daily.    . rosuvastatin (CRESTOR) 10 MG tablet Take 1 tablet (10 mg total) by mouth daily. 90 tablet 3  . tamsulosin (FLOMAX) 0.4 MG CAPS capsule Take 1 capsule (0.4 mg total) by mouth daily. 90 capsule 3   No current facility-administered medications for this visit.     Review of Systems Review of Systems  Constitutional: Negative.   Respiratory: Negative.   Cardiovascular: Negative.     Blood pressure (!) 100/58, pulse (!) 55, resp. rate 11, height 5\' 11"  (1.803 m), weight 176 lb (79.8 kg), SpO2 100 %.  Physical Exam Physical Exam  Constitutional: He is oriented to person, place, and time. He appears well-developed and well-nourished.  Neurological: He is alert and  oriented to person, place, and time.  Skin: Skin is warm and dry.  Psychiatric: His behavior is normal.  The site of the chest wall lipoma on the lower aspect of the left anterior chest wall was identified by the patient prior to the procedure and this was marked.  Data Reviewed 20 cc of 0.5% Xylocaine with 0.25% Marcaine with 1 to 200,000 units of epinephrine was utilized for local anesthesia well-tolerated.  An additional 4 cc 1% plain Xylocaine was used during the procedure.  The area was cleansed with ChloraPrep.  A transverse skin line incision over the area was made carried out the skin and subcutaneous tissue.  Bleeding control was with 3-0 Vicryl ties.  The fascia was divided and the lipoma was extracted.  This intrinsically involve the underlying muscle fibers which were dissected free.  Hemostasis at the deep level was completed with a single 3-0 Vicryl tie.  The specimen was placed in formalin for routine histology.  The wound was closed with a running 3-0 Vicryl suture to the layer of the superficial fascia and a running 3-0 Vicryl subcuticular suture for the skin.  Benzoin, Steri-Strips, Telfa and Tegaderm dressing was applied.  Ice pack provided.  10-minute observation.  Without incident.  Assessment    Lipoma left chest wall.  Plan    The patient will return if he has any concerns regarding wound healing.  He will remove the outer plastic dressing in 3 days.  OTC meds for analgesia.  Follow-up will be on an as-needed basis.     HPI, Physical Exam, Assessment and Plan have been scribed under the direction and in the presence of Robert Bellow, MD. Karie Fetch, RN  I have completed the exam and reviewed the above documentation for accuracy and completeness.  I agree with the above.  Haematologist has been used and any errors in dictation or transcription are unintentional.  Hervey Ard, M.D., F.A.C.S.   Garritt Molyneux Kimberley Speece 10/09/2017, 8:31 PM

## 2017-10-12 ENCOUNTER — Telehealth: Payer: Self-pay

## 2017-10-12 NOTE — Telephone Encounter (Signed)
Notified patient as instructed, patient pleased. Discussed follow-up appointments, patient agrees  

## 2017-10-12 NOTE — Telephone Encounter (Signed)
-----   Message from Robert Bellow, MD sent at 10/12/2017  7:14 AM EDT -----  Please notify path OK, fatty tissue as expected.  ----- Message ----- From: Interface, Lab In [facility] Sent: 10/11/2017   3:57 PM EDT To: Robert Bellow, MD

## 2017-12-04 IMAGING — DX DG LUMBAR SPINE COMPLETE 4+V
5 series · 5 of 5 positions shown · non-contrast
Comparison: No prior.

ADDENDUM:
Degenerative changes present about the lumbar spine are mild with
mild multilevel vertebral endplate osteophytes. Mild facet
hypertrophy also present.
CLINICAL DATA: Back pain with radiation down left leg.

EXAM:
LUMBAR SPINE - COMPLETE 4+ VIEW

[lumbar spine ap]
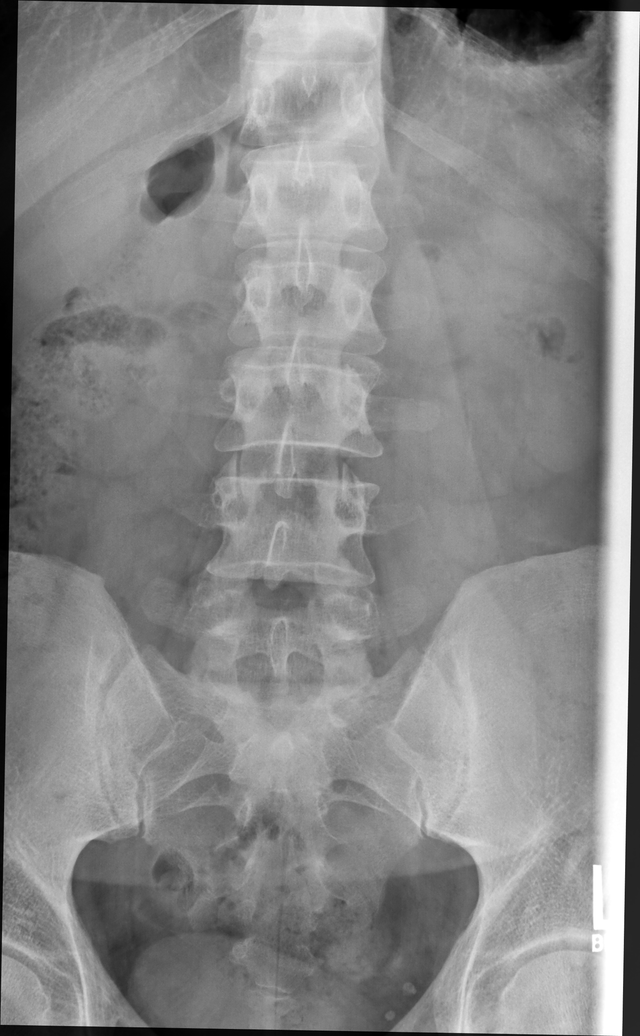

[lumbar spine obl (oblique) (1 of 2)]
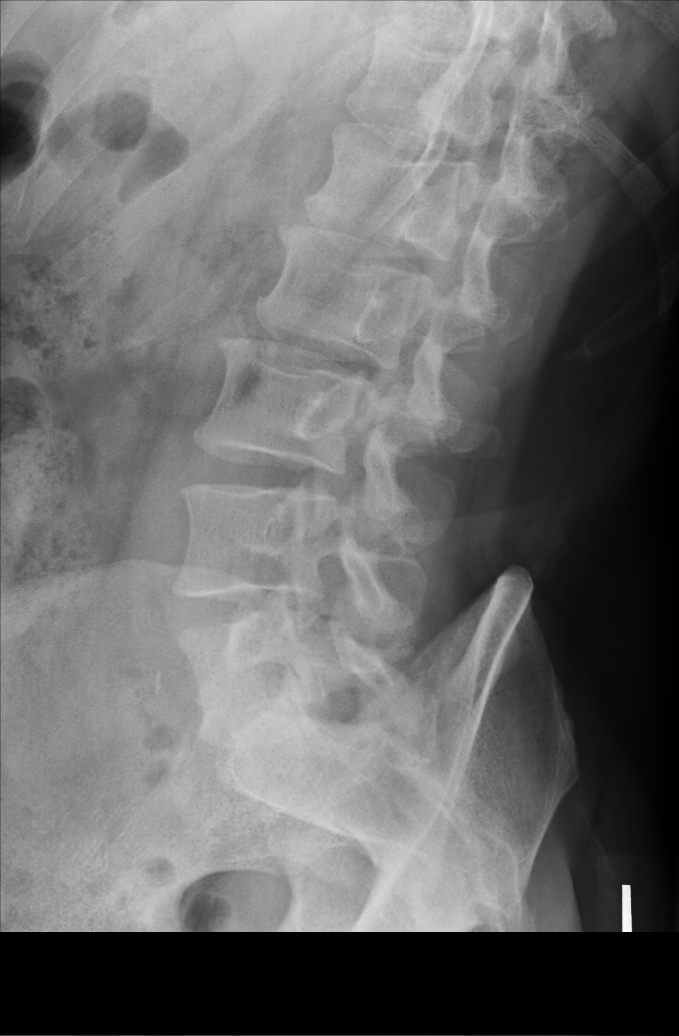

[lumbar spine obl (oblique) (2 of 2)]
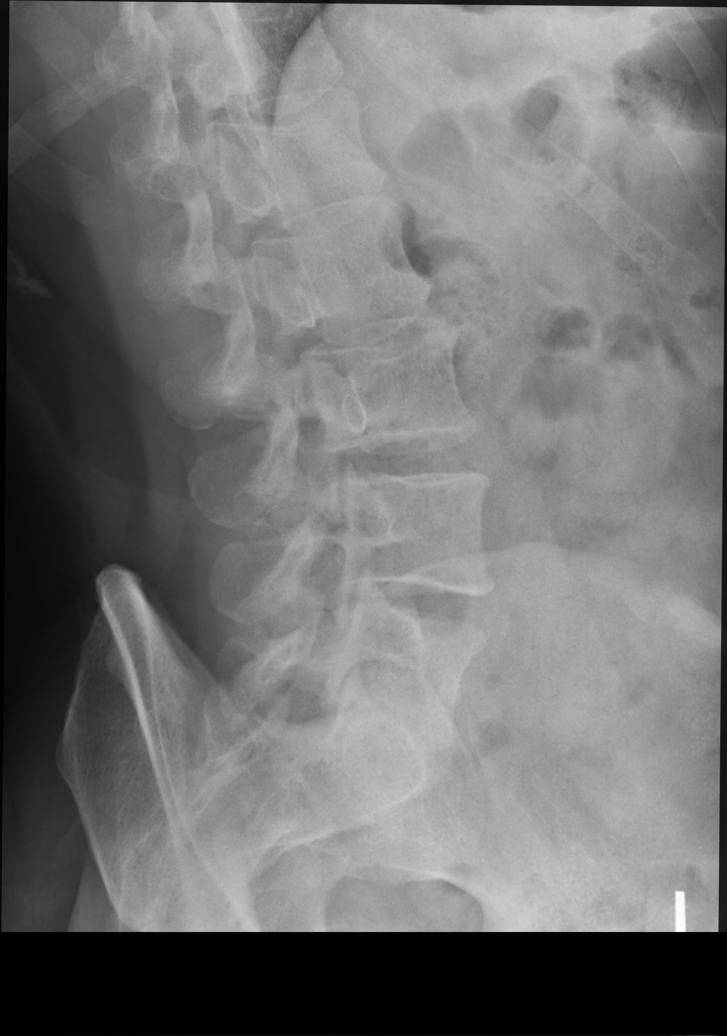

[lumbar spine lat]
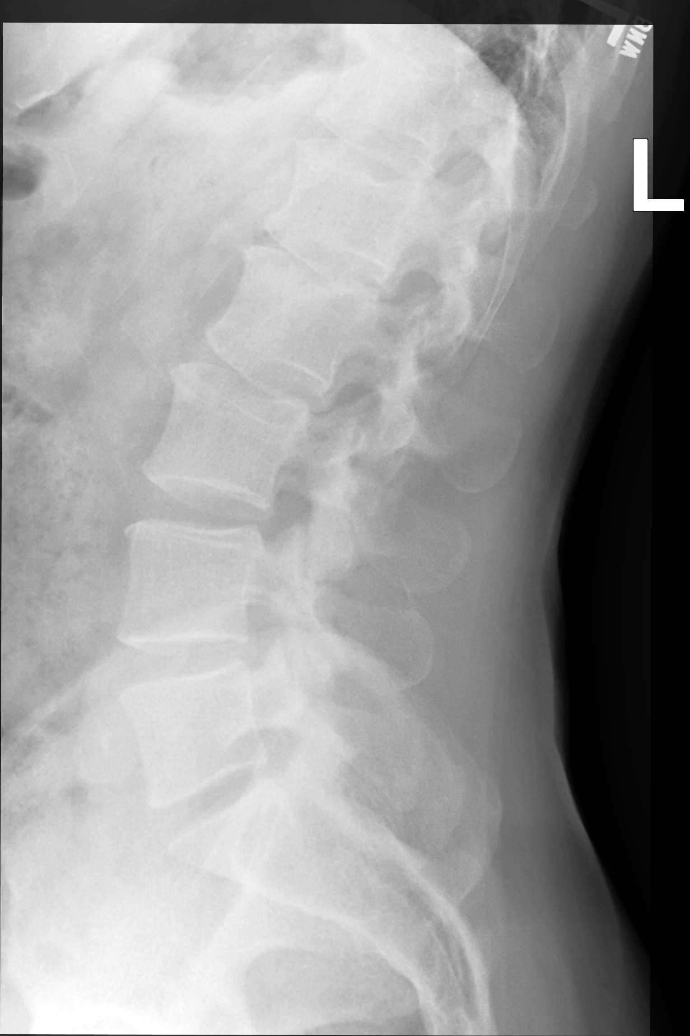

[lumbar spot lat]
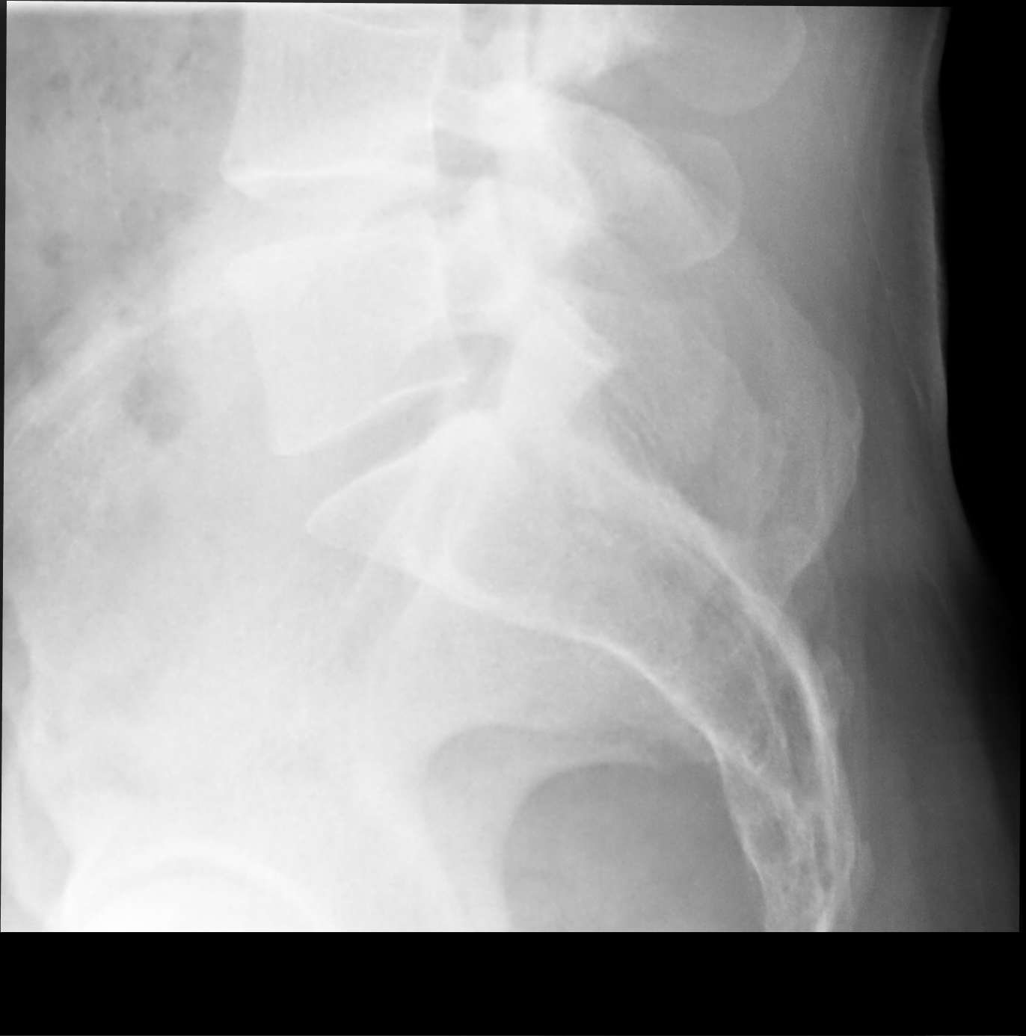

[5 of 5 positions shown; findings below may reference images not displayed]

FINDINGS: No acute bony abnormality identified. Normal alignment and
mineralization. Degenerative changes lumbar spine . Pelvic
calcifications consistent phleboliths.
IMPRESSION: Degenerative changes lumbar spine.  No acute abnormality.

## 2021-07-15 ENCOUNTER — Encounter: Payer: Self-pay | Admitting: Nurse Practitioner

## 2021-07-15 ENCOUNTER — Ambulatory Visit: Payer: Commercial Managed Care - PPO | Admitting: Nurse Practitioner

## 2021-07-15 ENCOUNTER — Ambulatory Visit (INDEPENDENT_AMBULATORY_CARE_PROVIDER_SITE_OTHER)
Admission: RE | Admit: 2021-07-15 | Discharge: 2021-07-15 | Disposition: A | Discharge status: Home / Self Care | Payer: Commercial Managed Care - PPO | Source: Ambulatory Visit | Attending: Nurse Practitioner | Admitting: Nurse Practitioner

## 2021-07-15 VITALS — BP 130/84 | HR 61 | Temp 97.3°F | Resp 14 | Ht 69.5 in | Wt 188.2 lb

## 2021-07-15 DIAGNOSIS — Z0000 Encounter for general adult medical examination without abnormal findings: Secondary | ICD-10-CM

## 2021-07-15 DIAGNOSIS — G8929 Other chronic pain: Secondary | ICD-10-CM

## 2021-07-15 DIAGNOSIS — R350 Frequency of micturition: Secondary | ICD-10-CM | POA: Diagnosis not present

## 2021-07-15 DIAGNOSIS — M545 Low back pain, unspecified: Secondary | ICD-10-CM | POA: Diagnosis not present

## 2021-07-15 DIAGNOSIS — Z1211 Encounter for screening for malignant neoplasm of colon: Secondary | ICD-10-CM

## 2021-07-15 DIAGNOSIS — Z125 Encounter for screening for malignant neoplasm of prostate: Secondary | ICD-10-CM | POA: Diagnosis not present

## 2021-07-15 DIAGNOSIS — E785 Hyperlipidemia, unspecified: Secondary | ICD-10-CM

## 2021-07-15 DIAGNOSIS — D1722 Benign lipomatous neoplasm of skin and subcutaneous tissue of left arm: Secondary | ICD-10-CM

## 2021-07-15 DIAGNOSIS — E663 Overweight: Secondary | ICD-10-CM | POA: Diagnosis not present

## 2021-07-15 DIAGNOSIS — N401 Enlarged prostate with lower urinary tract symptoms: Secondary | ICD-10-CM | POA: Diagnosis not present

## 2021-07-15 LAB — LIPID PANEL
Cholesterol: 270 mg/dL — ABNORMAL HIGH (ref 0–200)
HDL: 47.9 mg/dL (ref >39.00)
LDL Cholesterol: 200 mg/dL — ABNORMAL HIGH (ref 0–99)
NonHDL: 221.69
Total CHOL/HDL Ratio: 6
Triglycerides: 106 mg/dL (ref 0.0–149.0)
VLDL: 21.2 mg/dL (ref 0.0–40.0)

## 2021-07-15 LAB — COMPREHENSIVE METABOLIC PANEL
ALT: 13 U/L (ref 0–53)
AST: 18 U/L (ref 0–37)
Albumin: 4.6 g/dL (ref 3.5–5.2)
Alkaline Phosphatase: 50 U/L (ref 39–117)
BUN: 19 mg/dL (ref 6–23)
CO2: 30 mEq/L (ref 19–32)
Calcium: 10.4 mg/dL (ref 8.4–10.5)
Chloride: 103 mEq/L (ref 96–112)
Creatinine, Ser: 0.84 mg/dL (ref 0.40–1.50)
GFR: 96.95 mL/min (ref >60.00)
Glucose, Bld: 100 mg/dL — ABNORMAL HIGH (ref 70–99)
Potassium: 4.6 mEq/L (ref 3.5–5.1)
Sodium: 140 mEq/L (ref 135–145)
Total Bilirubin: 0.6 mg/dL (ref 0.2–1.2)
Total Protein: 7.4 g/dL (ref 6.0–8.3)

## 2021-07-15 LAB — CBC
HCT: 46.9 % (ref 39.0–52.0)
Hemoglobin: 15.5 g/dL (ref 13.0–17.0)
MCHC: 33 g/dL (ref 30.0–36.0)
MCV: 89.7 fl (ref 78.0–100.0)
Platelets: 195 10*3/uL (ref 150.0–400.0)
RBC: 5.23 Mil/uL (ref 4.22–5.81)
RDW: 13.8 % (ref 11.5–15.5)
WBC: 5 10*3/uL (ref 4.0–10.5)

## 2021-07-15 LAB — TSH: TSH: 1.11 u[IU]/mL (ref 0.35–5.50)

## 2021-07-15 LAB — PSA: PSA: 1.53 ng/mL (ref 0.10–4.00)

## 2021-07-15 LAB — HEMOGLOBIN A1C: Hgb A1c MFr Bld: 6.1 % (ref 4.6–6.5)

## 2021-07-15 NOTE — Assessment & Plan Note (Signed)
Encouraged healthy lifestyle modifications.

## 2021-07-15 NOTE — Assessment & Plan Note (Signed)
Historical diagnosis.  Patient states he is on tamsulosin in the past but had to discontinue medication due to loss of insurance in the past.  Pending PSA today can restart tamsulosin if normal

## 2021-07-15 NOTE — Progress Notes (Signed)
New Patient Office Visit  Subjective    Patient ID: Roger Kim, male    DOB: [birth year]  Age: 56 y.o. MRN: [id number]  CC:  Chief Complaint  Patient presents with  . Transfer of Care    Previous PCP with [place] [facility]   . 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 States that he has been having lower back. Has been seen by ortho in the past and dx with DDD  for complete physical and follow up of chronic conditions.  Immunizations: -Tetanus:2018 -Influenza: out of season -Covid-19: Refused -Shingles: information given -Pneumonia: too young  -HPV: aged out  Diet: [place]. States that he never has a real breakfast. Will have a glass of milk in the morning. Lighter lunch at work. Will have dinner. Water and juices.  Exercise: No regular exercise outside of employment.  Eye exam: glasses needs updating Dental exam: Needs updating   Colonoscopy: Ambulatory referral placed for [place]. Lung Cancer Screening: N/A Dexa: N/A  PSA: Due  Sleep: Gets up at 515 and will go to bed around 8-9. Feels rested since a sleep number bed. Gets up twice a night to urinate History of BPH  Outpatient Encounter Medications as of 07/15/2021  Medication Sig  . Ascorbic Acid (VITAMIN C) 1000 MG tablet Take 1,000 mg by mouth daily.  . fluticasone (FLONASE) 50 MCG/ACT nasal spray Place 2 sprays into both nostrils daily.  . Multiple Vitamin (MULTIVITAMIN) capsule Take 1 capsule by mouth daily.  . [DISCONTINUED] rosuvastatin (CRESTOR) 10 MG tablet Take 1 tablet (10 mg total) by mouth daily.  . [DISCONTINUED] tamsulosin (FLOMAX) 0.4 MG CAPS capsule Take 1 capsule (0.4 mg total) by mouth daily.   No facility-administered encounter medications on file as of 07/15/2021.    Past Medical History:  Diagnosis Date  . Allergic rhinitis    . Chicken pox   . Hypercholesteremia   . Prostate atrophy     Past Surgical History:  Procedure Laterality Date  . fish bone removed     was stuck in the voice box-same day surgery-age 31 or 21  . LIPOMA EXCISION     several times-different parts of the body  . thumb surgery      Family History  Problem Relation Age of Onset  . Alzheimer's disease Mother   . Throat cancer Father   . Other Sister        lipoma  . Breast cancer Sister   . Other Brother        lipoma  . Cancer Maternal Grandmother        not sure what kind  . Bone cancer Maternal Grandfather   . Cancer Paternal Grandmother        not sure of the type  . Cancer Paternal Grandfather        not sure pf the type    Social History   Socioeconomic History  . Marital status: Divorced    Spouse name: Not on file  . Number of children: 1  . Years of education: Not on file  . Highest education level: Not on file  Occupational History  . Not on file  Tobacco Use  . Smoking status: Former    Packs/day: 1.00    Years: 6.00    Total pack years:  6.00    Types: Cigarettes    Quit date: 03/21/1978    Years since quitting: 43.3  . Smokeless tobacco: Former  . Tobacco comments:    Smoked from age 57 to 12-use to dip some around age of 60  Vaping Use  . Vaping Use: Never used  Substance and Sexual Activity  . Alcohol use: No    Comment: rare  . Drug use: Not Currently    Comment: use to do drugs but quit many years ago, quit marijuana around 2009  . Sexual activity: Not on file  Other Topics Concern  . Not on file  Social History Narrative   Fulltime: Warehouse distribution.    Social Determinants of Health   Financial Resource Strain: Not on file  Food Insecurity: Not on file  Transportation Needs: Not on file  Physical Activity: Not on file  Stress: Not on file  Social Connections: Not on file  Intimate Partner Violence: Not on file    Review of Systems  Constitutional:  Positive for  malaise/fatigue. Negative for chills and fever.  Respiratory:  Negative for shortness of breath.   Cardiovascular:  Negative for chest pain and leg swelling.  Gastrointestinal:  Negative for abdominal pain, blood in stool, constipation, diarrhea, nausea and vomiting.       BM daily  Genitourinary:        Nocturia x2   Musculoskeletal:  Positive for back pain.  Neurological:  Negative for headaches.  Psychiatric/Behavioral:  Negative for hallucinations and suicidal ideas.         Objective    BP 130/84   Pulse 61   Temp (!) 97.3 F (36.3 C)   Resp 14   Ht 5' 9.5" (1.765 m)   Wt 188 lb 4 oz (85.4 kg)   SpO2 98%   BMI 27.40 kg/m   Physical Exam Vitals and nursing note reviewed. Exam conducted with a chaperone present Azalee Course [place], RMA).  Constitutional:      Appearance: Normal appearance.  HENT:     Right Ear: Tympanic membrane, ear canal and external ear normal.     Left Ear: Tympanic membrane, ear canal and external ear normal.     Mouth/Throat:     Mouth: Mucous membranes are moist.     Pharynx: Oropharynx is clear.  Eyes:     Extraocular Movements: Extraocular movements intact.     Pupils: Pupils are equal, round, and reactive to light.     Comments: Wears glasses  Cardiovascular:     Rate and Rhythm: Normal rate and regular rhythm.     Pulses: Normal pulses.     Heart sounds: Normal heart sounds.  Pulmonary:     Effort: Pulmonary effort is normal.     Breath sounds: Normal breath sounds.  Abdominal:     General: Bowel sounds are normal. There is no distension.     Palpations: There is no mass.     Tenderness: There is no abdominal tenderness.     Hernia: No hernia is present. There is no hernia in the left inguinal area or right inguinal area.  Genitourinary:    Penis: Normal.      Testes: Normal.     Epididymis:     Right: Normal.     Left: Normal.  Lymphadenopathy:     Cervical: No cervical adenopathy.     Lower Body: No right inguinal  adenopathy. No left inguinal adenopathy.  Skin:    General: Skin is warm.  Neurological:  General: No focal deficit present.     Mental Status: He is alert.     Deep Tendon Reflexes:     Reflex Scores:      Bicep reflexes are 2+ on the right side and 2+ on the left side.      Patellar reflexes are 2+ on the right side and 2+ on the left side.    Comments: Bilateral upper and lower extremity strength 5/5  Psychiatric:        Mood and Affect: Mood normal.        Behavior: Behavior normal.        Thought Content: Thought content normal.        Judgment: Judgment normal.        Assessment & Plan:   Problem List Items Addressed This Visit       Genitourinary   BPH (benign prostatic hyperplasia)    Historical diagnosis.  Patient states he is on tamsulosin in the past but had to discontinue medication due to loss of insurance in the past.  Pending PSA today can restart tamsulosin if normal      Relevant Orders   PSA     Other   Chronic low back pain    Exam stable in office today.  We will do lumbar picture to update imaging.      Relevant Orders   DG Lumbar Spine Complete   Hyperlipidemia    Patient used to be on Crestor per his report in the past has not been on medication due to loss of insurance in the past.  Pending labs today      Lipoma    History of multiple lipomas that have had to be removed from general surgery.  Patient states family numbers have the same disorder.  Continue to monitor      Preventative health care - Primary    Discussed age-appropriate immunizations and screening exams in office today      Relevant Orders   CBC   Comprehensive metabolic panel   Hemoglobin A1c   TSH   Overweight    Encouraged healthy lifestyle modifications      Relevant Orders   Hemoglobin A1c   Lipid panel   Other Visit Diagnoses     Screening for prostate cancer       Screening for colon cancer       Relevant Orders   Ambulatory referral to  Gastroenterology       Return in about 1 year (around 07/16/2022) for CPE and labs.   Romilda Garret, NP

## 2021-07-15 NOTE — Assessment & Plan Note (Signed)
Discussed age-appropriate immunizations and screening exams in office today

## 2021-07-15 NOTE — Assessment & Plan Note (Signed)
Patient used to be on Crestor per his report in the past has not been on medication due to loss of insurance in the past.  Pending labs today

## 2021-07-15 NOTE — Assessment & Plan Note (Signed)
History of multiple lipomas that have had to be removed from general surgery.  Patient states family numbers have the same disorder.  Continue to monitor

## 2021-07-15 NOTE — Patient Instructions (Signed)
Nice to see you today I will be in touch with the lab results.  Follow up with me in 1 year for your physical, sooner if you need me

## 2021-07-15 NOTE — Assessment & Plan Note (Signed)
Exam stable in office today.  We will do lumbar picture to update imaging.

## 2021-07-18 ENCOUNTER — Encounter: Payer: Self-pay | Admitting: Nurse Practitioner

## 2021-07-18 DIAGNOSIS — E785 Hyperlipidemia, unspecified: Secondary | ICD-10-CM

## 2021-07-18 MED ORDER — ROSUVASTATIN CALCIUM 5 MG PO TABS: 5.0000 mg | ORAL_TABLET | Freq: Every day | ORAL | 0 refills | Status: DC

## 2021-07-18 NOTE — Telephone Encounter (Signed)
Patient responded in regards to labs and being placed on cholesterol medication. He needs a 3 month fasting lab appointment please. Labs were placed

## 2021-07-19 NOTE — Telephone Encounter (Signed)
Spoke with patient and scheduled lab appointment

## 2021-10-28 ENCOUNTER — Other Ambulatory Visit (INDEPENDENT_AMBULATORY_CARE_PROVIDER_SITE_OTHER): Payer: Commercial Managed Care - PPO

## 2021-10-28 DIAGNOSIS — E785 Hyperlipidemia, unspecified: Secondary | ICD-10-CM | POA: Diagnosis not present

## 2021-10-28 LAB — COMPREHENSIVE METABOLIC PANEL
ALT: 15 U/L (ref 0–53)
AST: 19 U/L (ref 0–37)
Albumin: 4.5 g/dL (ref 3.5–5.2)
Alkaline Phosphatase: 48 U/L (ref 39–117)
BUN: 12 mg/dL (ref 6–23)
CO2: 31 mEq/L (ref 19–32)
Calcium: 9.8 mg/dL (ref 8.4–10.5)
Chloride: 103 mEq/L (ref 96–112)
Creatinine, Ser: 0.87 mg/dL (ref 0.40–1.50)
GFR: 95.73 mL/min (ref >60.00)
Glucose, Bld: 89 mg/dL (ref 70–99)
Potassium: 4.6 mEq/L (ref 3.5–5.1)
Sodium: 139 mEq/L (ref 135–145)
Total Bilirubin: 0.6 mg/dL (ref 0.2–1.2)
Total Protein: 7.7 g/dL (ref 6.0–8.3)

## 2021-10-28 LAB — LIPID PANEL
Cholesterol: 189 mg/dL (ref 0–200)
HDL: 45.7 mg/dL (ref >39.00)
LDL Cholesterol: 127 mg/dL — ABNORMAL HIGH (ref 0–99)
NonHDL: 143.51
Total CHOL/HDL Ratio: 4
Triglycerides: 81 mg/dL (ref 0.0–149.0)
VLDL: 16.2 mg/dL (ref 0.0–40.0)

## 2021-10-31 ENCOUNTER — Other Ambulatory Visit: Payer: Self-pay | Admitting: Nurse Practitioner

## 2021-10-31 DIAGNOSIS — E785 Hyperlipidemia, unspecified: Secondary | ICD-10-CM

## 2021-10-31 MED ORDER — ROSUVASTATIN CALCIUM 5 MG PO TABS: 5.0000 mg | ORAL_TABLET | Freq: Every day | ORAL | 2 refills | Status: DC

## 2022-03-03 ENCOUNTER — Telehealth: Payer: Self-pay | Admitting: Nurse Practitioner

## 2022-03-03 NOTE — Telephone Encounter (Signed)
Left a message on VM advising pt if he needs a report, it is available on MyChart under Test results. If he needs it on a disc, he will need to call back and ask them to have it on a disc for him to pickup. That request will need to go to [place] or [place].

## 2022-03-03 NOTE — Telephone Encounter (Signed)
Pt called asking if he could get a copy of the xray he had done back on 07/15/21? Call back # [phone]

## 2022-11-03 ENCOUNTER — Telehealth: Payer: Self-pay | Admitting: Nurse Practitioner

## 2022-11-03 NOTE — Telephone Encounter (Signed)
I am fine with this.

## 2022-11-03 NOTE — Telephone Encounter (Signed)
Spoke with patient, explained to me that he would prefer to see a male provider. Please advise, thanks

## 2022-11-03 NOTE — Telephone Encounter (Signed)
Please review mychart message from 10/31, patient is requesting to transfer care from [facility] to [place]. Patient scheduled new pt appointment via mychart in August. Reaching out now to make sure this is okay with both? Please advise, I have left appointment as is for now awaiting response.

## 2022-11-03 NOTE — Telephone Encounter (Signed)
Could we inquire why he wants this toc?  It looks like he has already had a TOC from [place] [facility].

## 2022-11-06 NOTE — Telephone Encounter (Signed)
TOC is fine with me

## 2022-11-10 ENCOUNTER — Encounter: Payer: Self-pay | Admitting: Family

## 2022-11-10 ENCOUNTER — Ambulatory Visit: Payer: Commercial Managed Care - PPO | Admitting: Family

## 2022-11-10 ENCOUNTER — Telehealth: Payer: Self-pay

## 2022-11-10 VITALS — BP 116/78 | HR 69 | Temp 98.0°F | Ht 69.5 in | Wt 159.2 lb

## 2022-11-10 DIAGNOSIS — M51362 Other intervertebral disc degeneration, lumbar region with discogenic back pain and lower extremity pain: Secondary | ICD-10-CM | POA: Insufficient documentation

## 2022-11-10 DIAGNOSIS — Z1211 Encounter for screening for malignant neoplasm of colon: Secondary | ICD-10-CM

## 2022-11-10 DIAGNOSIS — Z0001 Encounter for general adult medical examination with abnormal findings: Secondary | ICD-10-CM

## 2022-11-10 DIAGNOSIS — E7800 Pure hypercholesterolemia, unspecified: Secondary | ICD-10-CM

## 2022-11-10 DIAGNOSIS — M545 Low back pain, unspecified: Secondary | ICD-10-CM

## 2022-11-10 DIAGNOSIS — E785 Hyperlipidemia, unspecified: Secondary | ICD-10-CM

## 2022-11-10 DIAGNOSIS — R7303 Prediabetes: Secondary | ICD-10-CM

## 2022-11-10 DIAGNOSIS — N401 Enlarged prostate with lower urinary tract symptoms: Secondary | ICD-10-CM

## 2022-11-10 DIAGNOSIS — J302 Other seasonal allergic rhinitis: Secondary | ICD-10-CM | POA: Diagnosis not present

## 2022-11-10 DIAGNOSIS — D1721 Benign lipomatous neoplasm of skin and subcutaneous tissue of right arm: Secondary | ICD-10-CM

## 2022-11-10 DIAGNOSIS — R351 Nocturia: Secondary | ICD-10-CM

## 2022-11-10 DIAGNOSIS — G8929 Other chronic pain: Secondary | ICD-10-CM

## 2022-11-10 HISTORY — DX: Benign lipomatous neoplasm of skin and subcutaneous tissue of right arm: D17.21

## 2022-11-10 LAB — BASIC METABOLIC PANEL
BUN: 16 mg/dL (ref 6–23)
CO2: 30 meq/L (ref 19–32)
Calcium: 9.6 mg/dL (ref 8.4–10.5)
Chloride: 105 meq/L (ref 96–112)
Creatinine, Ser: 0.87 mg/dL (ref 0.40–1.50)
GFR: 95.04 mL/min (ref >60.00)
Glucose, Bld: 95 mg/dL (ref 70–99)
Potassium: 4.4 meq/L (ref 3.5–5.1)
Sodium: 141 meq/L (ref 135–145)

## 2022-11-10 LAB — CBC
HCT: 42 % (ref 39.0–52.0)
Hemoglobin: 13.7 g/dL (ref 13.0–17.0)
MCHC: 32.5 g/dL (ref 30.0–36.0)
MCV: 82.7 fL (ref 78.0–100.0)
Platelets: 227 10*3/uL (ref 150.0–400.0)
RBC: 5.08 Mil/uL (ref 4.22–5.81)
RDW: 16.2 % — ABNORMAL HIGH (ref 11.5–15.5)
WBC: 4.2 10*3/uL (ref 4.0–10.5)

## 2022-11-10 LAB — LIPID PANEL
Cholesterol: 200 mg/dL (ref 0–200)
HDL: 48.4 mg/dL (ref >39.00)
LDL Cholesterol: 142 mg/dL — ABNORMAL HIGH (ref 0–99)
NonHDL: 151.31
Total CHOL/HDL Ratio: 4
Triglycerides: 47 mg/dL (ref 0.0–149.0)
VLDL: 9.4 mg/dL (ref 0.0–40.0)

## 2022-11-10 LAB — PSA: PSA: 2.07 ng/mL (ref 0.10–4.00)

## 2022-11-10 LAB — HEMOGLOBIN A1C: Hgb A1c MFr Bld: 6.3 % (ref 4.6–6.5)

## 2022-11-10 NOTE — Progress Notes (Signed)
Established Patient Office Visit  Subjective:  Patient ID: Roger Kim, male    DOB: 1965/02/25  Age: 57 y.o. MRN: [id number]  CC:  Chief Complaint  Patient presents with  . Establish Care    TOC from Audria Nine, NP. Having issues with acid hearburn/indigestion.    HPI Roger Kim is here for a transition of care visit.  Prior provider was: Audria Nine, NP   Pt is with acute concerns.   Utd on dental exams. Utd on eye exams as well, does wear glasses.  No advanced directive.  Exercise: no regular exercise routine at this time, will walk at [facility] Diet: regular.   chronic concerns:  GERD symptoms started a few months ago, and at times felt like he was going to vomit. But lately has been much better, has lost 30 pounds and changed his diet.  Was taking tums prn but not really needing anymore in the last few weeks. Did have increased gas at the time as well but has been good.   Wt Readings from Last 3 Encounters:  11/10/22 159 lb 3.2 oz (72.2 kg)  07/15/21 188 lb 4 oz (85.4 kg)  10/09/17 176 lb (79.8 kg)   Hyperlipidemia: elevated LDL. Recently with weight loss, looking to repeat this level to see if improved.   DDD lumbar spine, mild degenerative pain with facet arthropathy at L4-L5 and L5-S1. First injury to his lower back in when he was 57 y/o while lifting boxes. Pain without much improvement. Works on back stretches, does not use ibuprofen anymore was irritating his stomach takes tylenol if he really needs it. Does have inversion table but doesn't use it much has never been to physical therapy or neuro surgery.    Past Medical History:  Diagnosis Date  . Allergic rhinitis   . Chicken pox   . Hypercholesteremia   . Prostate atrophy     Past Surgical History:  Procedure Laterality Date  . fish bone removed     was stuck in the voice box-same day surgery-age 41 or 21  . LIPOMA EXCISION     several times-different parts of the body  . thumb surgery       Family History  Problem Relation Age of Onset  . Alzheimer's disease Mother   . Throat cancer Father   . Other Sister        lipoma  . Breast cancer Sister   . Other Brother        lipoma  . Cancer Maternal Grandmother        not sure what kind  . Bone cancer Maternal Grandfather   . Cancer Paternal Grandmother        not sure of the type  . Cancer Paternal Grandfather        not sure pf the type    Social History   Socioeconomic History  . Marital status: Divorced    Spouse name: Not on file  . Number of children: 1  . Years of education: Not on file  . Highest education level: GED or equivalent  Occupational History    Employer: [employer]  Tobacco Use  . Smoking status: Former    Current packs/day: 0.00    Average packs/day: 1 pack/day for 6.0 years (6.0 ttl pk-yrs)    Types: Cigarettes    Start date: 03/20/1972    Quit date: 03/21/1978    Years since quitting: 44.6  . Smokeless tobacco: Former  . Tobacco  comments:    Smoked from age 36 to 12-use to dip some around age of 43  Vaping Use  . Vaping status: Never Used  Substance and Sexual Activity  . Alcohol use: No    Comment: rare  . Drug use: Not Currently    Comment: use to do drugs but quit many years ago, quit marijuana around 2009  . Sexual activity: Not Currently    Partners: Male  Other Topics Concern  . Not on file  Social History Narrative   Fulltime: Warehouse distribution.    Social Determinants of Health   Financial Resource Strain: Low Risk  (11/06/2022)   Overall Financial Resource Strain (CARDIA)   . Difficulty of Paying Living Expenses: Not very hard  Food Insecurity: Food Insecurity Present (11/06/2022)   Hunger Vital Sign   . Worried About Programme researcher, broadcasting/film/video in the Last Year: Sometimes true   . Ran Out of Food in the Last Year: Sometimes true  Transportation Needs: No Transportation Needs (11/06/2022)   PRAPARE - Transportation   . Lack of Transportation (Medical): No   . Lack  of Transportation (Non-Medical): No  Physical Activity: Insufficiently Active (11/06/2022)   Exercise Vital Sign   . Days of Exercise per Week: 1 day   . Minutes of Exercise per Session: 60 min  Stress: No Stress Concern Present (11/06/2022)   [employer] of Occupational Health - Occupational Stress Questionnaire   . Feeling of Stress : Only a little  Social Connections: Unknown (11/06/2022)   Social Connection and Isolation Panel [NHANES]   . Frequency of Communication with Friends and Family: Twice a week   . Frequency of Social Gatherings with Friends and Family: Once a week   . Attends Religious Services: Patient declined   . Active Member of Clubs or Organizations: No   . Attends Banker Meetings: Not on file   . Marital Status: Divorced  Catering manager Violence: Not on file    Outpatient Medications Prior to Visit  Medication Sig Dispense Refill  . Ascorbic Acid (VITAMIN C) 1000 MG tablet Take 1,000 mg by mouth daily.    . fluticasone (FLONASE) 50 MCG/ACT nasal spray Place 2 sprays into both nostrils daily. 16 g 0  . Multiple Vitamin (MULTIVITAMIN) capsule Take 1 capsule by mouth daily.    . rosuvastatin (CRESTOR) 5 MG tablet Take 1 tablet (5 mg total) by mouth daily. 90 tablet 2   No facility-administered medications prior to visit.    Allergies  Allergen Reactions  . Penicillins Other (See Comments)    Unknown, when he was a child.     ROS: Pertinent symptoms negative unless otherwise noted in HPI      Objective:    Physical Exam Vitals reviewed.  Constitutional:      General: He is not in acute distress.    Appearance: Normal appearance. He is normal weight. He is not ill-appearing, toxic-appearing or diaphoretic.  HENT:     Head: Normocephalic.     Right Ear: Tympanic membrane normal.     Left Ear: Tympanic membrane normal.     Nose: Nose normal.     Mouth/Throat:     Mouth: Mucous membranes are moist.  Eyes:     Pupils: Pupils are  equal, round, and reactive to light.  Cardiovascular:     Rate and Rhythm: Normal rate and regular rhythm.  Pulmonary:     Effort: Pulmonary effort is normal.     Breath sounds: Normal  breath sounds.  Abdominal:     General: Abdomen is flat.     Tenderness: There is no abdominal tenderness.  Musculoskeletal:        General: Normal range of motion.  Skin:    General: Skin is warm.     Comments: Boggy mass nonmobile to right upper lateral shoulder , nontender  Neurological:     General: No focal deficit present.     Mental Status: He is alert and oriented to person, place, and time. Mental status is at baseline.  Psychiatric:        Mood and Affect: Mood normal.        Behavior: Behavior normal.        Thought Content: Thought content normal.        Judgment: Judgment normal.       BP 116/78 (BP Location: Left Arm, Patient Position: Sitting, Cuff Size: Normal)   Pulse 69   Temp 98 F (36.7 C) (Temporal)   Ht 5' 9.5" (1.765 m)   Wt 159 lb 3.2 oz (72.2 kg)   SpO2 99%   BMI 23.17 kg/m  Wt Readings from Last 3 Encounters:  11/10/22 159 lb 3.2 oz (72.2 kg)  07/15/21 188 lb 4 oz (85.4 kg)  10/09/17 176 lb (79.8 kg)     Health Maintenance Due  Topic Date Due  . Hepatitis C Screening  Never done  . Colonoscopy  Never done  . Zoster Vaccines- Shingrix (1 of 2) Never done    There are no preventive care reminders to display for this patient.  Lab Results  Component Value Date   TSH 1.11 07/15/2021   Lab Results  Component Value Date   WBC 5.0 07/15/2021   HGB 15.5 07/15/2021   HCT 46.9 07/15/2021   MCV 89.7 07/15/2021   PLT 195.0 07/15/2021   Lab Results  Component Value Date   NA 139 10/28/2021   K 4.6 10/28/2021   CO2 31 10/28/2021   GLUCOSE 89 10/28/2021   BUN 12 10/28/2021   CREATININE 0.87 10/28/2021   BILITOT 0.6 10/28/2021   ALKPHOS 48 10/28/2021   AST 19 10/28/2021   ALT 15 10/28/2021   PROT 7.7 10/28/2021   ALBUMIN 4.5 10/28/2021   CALCIUM  9.8 10/28/2021   ANIONGAP 6 (L) 01/12/2012   GFR 95.73 10/28/2021   Lab Results  Component Value Date   CHOL 189 10/28/2021   Lab Results  Component Value Date   HDL 45.70 10/28/2021   Lab Results  Component Value Date   LDLCALC 127 (H) 10/28/2021   Lab Results  Component Value Date   TRIG 81.0 10/28/2021   Lab Results  Component Value Date   CHOLHDL 4 10/28/2021   Lab Results  Component Value Date   HGBA1C 6.1 07/15/2021      Assessment & Plan:   Seasonal allergies Assessment & Plan: Flonase prn    Benign prostatic hyperplasia with nocturia Assessment & Plan: Ordering psa pending results.  Stable with symptoms.    BPH with lower urinary tract symptoms without urinary obstruction Assessment & Plan: Ordering psa pending results.  Stable with symptoms.    Elevated LDL cholesterol level  Prediabetes Assessment & Plan: Pt advised of the following: Work on a diabetic diet, try to incorporate exercise at least 20-30 a day for 3 days a week or more. Ordering A1c pending results.     Lipoma of right shoulder  Screening for colon cancer  Degeneration of intervertebral disc of  lumbar region with discogenic back pain and lower extremity pain  Chronic left-sided low back pain without sciatica Assessment & Plan: Ongoing, at times acute on chronic, not currently.  Order placed for physical therapy, if not helpful will refer to neuro surgery. Exercises printed out and handed to pt upon discharge.   Tylenol prn   Encounter for general adult medical examination with abnormal findings Assessment & Plan: Patient Counseling(The following topics were reviewed):  Preventative care handout given to pt  Health maintenance and immunizations reviewed. Please refer to Health maintenance section. Pt advised on safe sex, wearing seatbelts in car, and proper nutrition labwork ordered today for annual Dental health: Discussed importance of regular tooth brushing,  flossing, and dental visits.    Hyperlipidemia, unspecified hyperlipidemia type Assessment & Plan: Ordered lipid panel, pending results. Work on low cholesterol diet and exercise as tolerated    Lipoma of right upper extremity Assessment & Plan: Referral to general surgery for eval/treat     No orders of the defined types were placed in this encounter.   Follow-up: Return in about 1 year (around 11/10/2023) for f/u CPE.    Mort Sawyers, FNP

## 2022-11-10 NOTE — Assessment & Plan Note (Signed)
Flonase prn

## 2022-11-10 NOTE — Telephone Encounter (Signed)
Patient not ready to schedule his colonoscopy.  He has requested a call back in January to schedule.  Thanks, [place], [place]

## 2022-11-10 NOTE — Assessment & Plan Note (Signed)
Ordered lipid panel, pending results. Work on low cholesterol diet and exercise as tolerated  

## 2022-11-10 NOTE — Assessment & Plan Note (Signed)
Ordering psa pending results.  Stable with symptoms.

## 2022-11-10 NOTE — Assessment & Plan Note (Addendum)
Ongoing, at times acute on chronic, not currently.  Order placed for physical therapy, if not helpful will refer to neuro surgery. Exercises printed out and handed to pt upon discharge.   Tylenol prn

## 2022-11-10 NOTE — Assessment & Plan Note (Signed)
Referral to general surgery for eval/treat

## 2022-11-10 NOTE — Assessment & Plan Note (Addendum)
Pt advised of the following: Work on a diabetic diet, try to incorporate exercise at least 20-30 a day for 3 days a week or more.  Ordering A1c pending results. 

## 2022-11-10 NOTE — Patient Instructions (Addendum)
    A referral was placed today for physical therapy for your low back.  Please let us know if you have not heard back within 2 weeks about the referral.  A referral was placed today for GI for colonoscopy  Please let us know if you have not heard back within 2 weeks about the referral.   Regards,   Laycie Schriner FNP-C

## 2022-11-10 NOTE — Assessment & Plan Note (Signed)

## 2023-01-26 ENCOUNTER — Encounter: Payer: Self-pay | Admitting: Surgery

## 2023-01-26 ENCOUNTER — Ambulatory Visit: Payer: Commercial Managed Care - PPO | Admitting: Surgery

## 2023-01-26 VITALS — BP 111/71 | HR 61 | Temp 98.0°F | Ht 70.0 in | Wt 162.0 lb

## 2023-01-26 DIAGNOSIS — D1721 Benign lipomatous neoplasm of skin and subcutaneous tissue of right arm: Secondary | ICD-10-CM | POA: Diagnosis not present

## 2023-01-26 DIAGNOSIS — D1722 Benign lipomatous neoplasm of skin and subcutaneous tissue of left arm: Secondary | ICD-10-CM | POA: Diagnosis not present

## 2023-01-26 NOTE — Progress Notes (Signed)
01/26/2023  Reason for Visit: Lipomas of right shoulder, right upper extremity, and left upper extremity  Requesting Provider: Mort Sawyers, FNP  History of Present Illness: Roger Kim is a 58 y.o. male presenting for evaluation of multiple lipomas.  The patient is known to our practice and had prior lipoma excisions with Dr. Lemar Livings in 2019 in the left upper extremity and the left lateral chest wall.  Patient reports that he has developed over the years other lipomas in the right shoulder, left upper extremity, and right upper extremity.  The one that is of most concern to him at this moment is the right shoulder.  Reports some discomfort in the area and reports that it is growing in size.  Denies any redness or inflammation of the skin and denies any drainage.  The other lipomas have been overall stable with only minor discomfort.  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 Authorizing Provider  Ascorbic Acid (VITAMIN C) 1000 MG tablet Take 1,000 mg by mouth daily.   Yes [provider]  fluticasone (FLONASE) 50 MCG/ACT nasal spray Place 2 sprays into both nostrils daily. 02/18/17  Yes Defelice, Para March, NP  Multiple Vitamin (MULTIVITAMIN) capsule Take 1 capsule by mouth daily.   Yes [provider]    Allergies: Allergies  Allergen Reactions   Penicillins Other (See Comments)    Unknown, when he was a child.     Social History:  reports that he quit smoking about 44 years ago. His smoking use included cigarettes. He  started smoking about 50 years ago. He has a 6 pack-year smoking history. He has been exposed to tobacco smoke. He has quit using smokeless tobacco. He reports that he does not currently use drugs. He reports that he does not drink alcohol.   Family History: Family History  Problem Relation Age of Onset   Alzheimer's disease Mother    Throat cancer Father    Other Sister        lipoma   Breast cancer Sister    Other Brother        lipoma   Cancer Maternal Grandmother        not sure what kind   Bone cancer Maternal Grandfather    Cancer Paternal Grandmother        not sure of the type   Cancer Paternal Grandfather        not sure pf the type    Review of Systems: Review of Systems  Constitutional:  Negative for chills and fever.  Respiratory:  Negative for shortness of breath.   Cardiovascular:  Negative for chest pain.  Gastrointestinal:  Negative for nausea and vomiting.  Skin:        Multiple lipomas of right shoulder, right upper extremity, and left upper extremity    Physical Exam BP 111/71   Pulse 61   Temp 98 F (36.7 C)   Ht 5\' 10"  (1.778 m)   Wt  162 lb (73.5 kg)   SpO2 97%   BMI 23.24 kg/m  CONSTITUTIONAL: No acute distress, well-nourished HEENT:  Normocephalic, atraumatic, extraocular motion intact. RESPIRATORY:  Normal respiratory effort without pathologic use of accessory muscles. CARDIOVASCULAR: Regular rhythm and rate. MUSCULOSKELETAL:  Normal muscle strength and tone in all four extremities.  No peripheral edema or cyanosis. SKIN: The patient has a 4.5 cm lipoma in the right shoulder which is soft, mobile, without any induration or erythema.  Patient also has a 2 cm lipoma in the right forearm which is also mobile, without any erythema or induration.  He also has a 1.2 cm lipoma in the posterior aspect of the left proximal elbow which is also mobile although a little bit firmer compared to the rest.  He also has a 1 cm lipoma in the mid left forearm which  is also soft and mobile without any erythema or induration.  NEUROLOGIC:  Motor and sensation is grossly normal.  Cranial nerves are grossly intact. PSYCH:  Alert and oriented to person, place and time. Affect is normal.  Laboratory Analysis: No results found for this or any previous visit (from the past 24 hours).  Imaging: No results found.  Assessment and Plan: This is a 58 y.o. male with multiple lipomas.  - Discussed with patient the findings on exam today and I think all of these are consistent with lipomas.  The right shoulder lipoma is the largest and the 1 that causes him the most discomfort.  He would like this to be excised.  I think based on size and location, we should be able to do this as an office excision.  He is in agreement. - We will schedule him for office procedure next week.  Discussed the procedure at length with him including the planned incision, risks of bleeding, infection, injury to surrounding structures, pain control, and he is willing to proceed. - All of his questions have been answered.  I spent 30 minutes dedicated to the care of this patient on the date of this encounter to include pre-visit review of records, face-to-face time with the patient discussing diagnosis and management, and any post-visit coordination of care.   Howie Ill, MD [facility]

## 2023-01-26 NOTE — Patient Instructions (Signed)
We will schedule you to come in to have this lipoma removed. You may drive yourself but may have someone with you.

## 2023-01-30 ENCOUNTER — Encounter: Payer: Self-pay | Admitting: *Deleted

## 2023-02-02 ENCOUNTER — Ambulatory Visit (INDEPENDENT_AMBULATORY_CARE_PROVIDER_SITE_OTHER): Payer: Commercial Managed Care - PPO | Admitting: Surgery

## 2023-02-02 ENCOUNTER — Encounter: Payer: Self-pay | Admitting: Surgery

## 2023-02-02 VITALS — BP 120/76 | HR 58 | Temp 97.9°F | Ht 70.0 in | Wt 164.4 lb

## 2023-02-02 DIAGNOSIS — D1721 Benign lipomatous neoplasm of skin and subcutaneous tissue of right arm: Secondary | ICD-10-CM

## 2023-02-02 NOTE — Patient Instructions (Signed)
Today we have removed a Lipoma in our office. Please see information below regarding this type of tumor.  You are free to shower 02/03/2023. Do not scrub at the area.   You have glue on your skin and sutures under the skin. The glue will come off on it's own in 10-14 days. You may shower normally until this occurs but do not submerge.  Please use Tylenol or Ibuprofen for pain as needed. You may use ice to the area 3-4 times today and tomorrow for any achiness.   We will see you back in 7-10 days to ensure that this has healed and to review the final pathology. Please see your appointment below. You may continue your regular activities right away but if you are having pain while doing something, stop what you are doing and try this activity once again in 3 days. Please call our office with any questions or concerns prior to your appointment.  Call to report any excessive bleeding, spreading redness, or increased pain.    Lipoma Removal Lipoma removal is a surgical procedure to remove a noncancerous (benign) tumor that is made up of fat cells (lipoma). Most lipomas are small and painless and do not require treatment. They can form in many areas of the body but are most common under the skin of the back, shoulders, arms, and thighs. You may need lipoma removal if you have a lipoma that is large, growing, or causing discomfort. Lipoma removal may also be done for cosmetic reasons. Tell a health care provider about: Any allergies you have. All medicines you are taking, including vitamins, herbs, eye drops, creams, and over-the-counter medicines. Any problems you or family members have had with anesthetic medicines. Any blood disorders you have. Any surgeries you have had. Any medical conditions you have. Whether you are pregnant or may be pregnant. What are the risks? Generally, this is a safe procedure. However, problems may occur, including: Infection. Bleeding. Allergic reactions to  medicines. Damage to nerves or blood vessels near the lipoma. Scarring.  Medicines Ask your health care provider about: Changing or stopping your regular medicines. This is especially important if you are taking diabetes medicines or blood thinners. Taking medicines such as aspirin and ibuprofen. These medicines can thin your blood. Do not take these medicines before your procedure if your health care provider instructs you not to. You may be given antibiotic medicine to help prevent infection. General instructions Ask your health care provider how your surgical site will be marked or identified. You will have a physical exam. Your health care provider will check the size of the lipoma and whether it can be moved easily.  What happens during the procedure? To reduce your risk of infection: Your health care team will wash or sanitize their hands. Your skin will be washed with surgical soap. You will be given the following: A medicine to numb the area (local anesthetic). An incision will be made over the lipoma or very near the lipoma. The incision may be made in a natural skin line or crease. Tissues, nerves, and blood vessels near the lipoma will be moved out of the way. The lipoma and the capsule that surrounds it will be separated from the surrounding tissues. The lipoma will be removed. The incision may be closed with stitches and surgical glue

## 2023-02-02 NOTE — Progress Notes (Signed)
  Procedure Date:  02/02/2023  Pre-operative Diagnosis: Right shoulder lipoma  Post-operative Diagnosis: Right shoulder lipoma, 4.5 cm.  Procedure:  Excision of right shoulder lipoma  Surgeon:  Howie Ill, MD  Anesthesia:  13 ml of 1% lidocaine with epi  Estimated Blood Loss:  15 ml  Specimens: Right shoulder lipoma  Complications: None  Indications for Procedure:  This is a 58 y.o. male with diagnosis of a symptomatic right shoulder lipoma.  The patient wishes to have this excised. The risks of bleeding, abscess or infection, injury to surrounding structures, and need for further procedures were all discussed with the patient and he was willing to proceed.  Description of Procedure: The patient was correctly identified at bedside.  The patient was placed supine.  Appropriate time-outs were performed.  The patient's right shoulder was prepped and draped in usual sterile fashion.  Local anesthetic was infused intradermally.  A 4 cm incision was made over the lipoma, and scalpel was used to dissect down the skin and subcutaneous tissue.  Skin flaps were created sharply, and then the lipoma was excised.  Its deep margin was the muscular fascia.  It was sent off to pathology.  The cavity was then irrigated and hemostasis was assured with 3-0 Vicryl suture at the proximal corner of the incision.  The wound was then closed in two layers using 3-0 Vicryl and 4-0 Monocryl.  The incision was cleaned and sealed with DermaBond.  The patient tolerated the procedure well and all sharps were appropriately disposed of at the end of the case.  - Patient may shower tomorrow. - May take Tylenol or ibuprofen for pain control. - Activity restrictions discussed with the patient.  Will give him a work note. - Follow-up in 1 week   Howie Ill, MD

## 2023-02-07 LAB — SURGICAL PATHOLOGY

## 2023-02-09 ENCOUNTER — Encounter: Payer: Self-pay | Admitting: Surgery

## 2023-02-09 ENCOUNTER — Ambulatory Visit (INDEPENDENT_AMBULATORY_CARE_PROVIDER_SITE_OTHER): Payer: Commercial Managed Care - PPO | Admitting: Surgery

## 2023-02-09 VITALS — BP 119/75 | HR 58 | Temp 97.8°F | Ht 70.0 in | Wt 162.8 lb

## 2023-02-09 DIAGNOSIS — Z09 Encounter for follow-up examination after completed treatment for conditions other than malignant neoplasm: Secondary | ICD-10-CM

## 2023-02-09 DIAGNOSIS — D1721 Benign lipomatous neoplasm of skin and subcutaneous tissue of right arm: Secondary | ICD-10-CM

## 2023-02-09 NOTE — Patient Instructions (Signed)
 Lipoma Removal, Care After The following information offers guidance on how to care for yourself after your procedure. Your health care provider may also give you more specific instructions. If you have problems or questions, contact your health care provider. What can I expect after the procedure? After the procedure, it is common to have: Mild pain. Swelling. Bruising. Follow these instructions at home: Bathing  Do not take baths, swim, or use a hot tub until your health care provider approves. Ask your health care provider if you may take showers. You may only be allowed to take sponge baths. Keep your bandage (dressing) clean and dry until your health care provider says it can be removed. Incision care  Follow instructions from your health care provider about how to take care of your incision. Make sure you: Wash your hands with soap and water for at least 20 seconds before and after you change your dressing. If soap and water are not available, use hand sanitizer. Change your dressing as told by your health care provider. Leave stitches (sutures), skin glue, or adhesive strips in place. These skin closures may need to stay in place for 2 weeks or longer. If adhesive strip edges start to loosen and curl up, you may trim the loose edges. Do not remove adhesive strips completely unless your health care provider tells you to do that. Check your incision area every day for signs of infection. Check for: More redness, swelling, or pain. Fluid or blood. Warmth. Pus or a bad smell. Medicines Take over-the-counter and prescription medicines only as told by your health care provider. If you were prescribed an antibiotic medicine, use it as told by your health care provider. Do not stop using the antibiotic even if you start to feel better. General instructions  If you were given a sedative during the procedure, it can affect you for several hours. Do not drive or operate machinery until your  health care provider says that it is safe. Do not use any products that contain nicotine or tobacco before the procedure. These products include cigarettes, chewing tobacco, and vaping devices, such as e-cigarettes. These can delay healing after surgery. If you need help quitting, ask your health care provider. Return to your normal activities as told by your health care provider. Ask your health care provider what activities are safe for you. Keep all follow-up visits. This is important. Contact a health care provider if: You have more redness, swelling, or pain around your incision. You have fluid or blood coming from your incision. Your incision feels warm to the touch. You have pus or a bad smell coming from your incision. You have pain that does not get better with medicine. Get help right away if: You have chills or a fever. You have severe pain. Summary After the procedure, it is common to have mild pain, swelling, and bruising. Follow instructions from your health care provider about how to take care of your incision. Contact a health care provider if you have signs of infection such as more redness, swelling, or pain. This information is not intended to replace advice given to you by your health care provider. Make sure you discuss any questions you have with your health care provider. Document Revised: 01/07/2021 Document Reviewed: 01/07/2021 Elsevier Patient Education  2024 [employer].

## 2023-02-09 NOTE — Progress Notes (Signed)
 02/09/2023  HPI: Roger Kim is a 58 y.o. male s/p excision of right shoulder lipoma on 02/02/2023.  Patient presents today for follow-up.  He reports that he is doing well.  Did have significant bruising around the incision after this procedure but otherwise has only needed Tylenol for pain control.  Pathology results showed this to be a lipoma.  Vital signs: BP 119/75   Pulse (!) 58   Temp 97.8 F (36.6 C) (Oral)   Ht 5' 10 (1.778 m)   Wt 162 lb 12.8 oz (73.8 kg)   SpO2 98%   BMI 23.36 kg/m    Physical Exam: Constitutional: No acute distress Skin: Right shoulder incision is healing well and is clean, dry, intact with Dermabond still in place.  There is surrounding ecchymosis which is improving.  Assessment/Plan: This is a 58 y.o. male s/p excision of right shoulder lipoma.  - Patient is healing well and discussed with him the likely the ecchymosis is a result that we had to shave the lipoma off of the muscular fascia.  This will continue to improve and resolve on its own. - Discussed with him the pathology results showing this to be a lipoma. - Follow-up as needed.   Aloysius Sheree Plant, MD [facility]

## 2023-09-10 ENCOUNTER — Telehealth: Payer: Self-pay

## 2023-09-10 NOTE — Telephone Encounter (Signed)
  Mr. Krupinski contacted me today to schedule his colonoscopy.  I informed him that at this time the date that I have available is 12/25/23.  Unfortunately, I could not offer him anything sooner because we don't have anything sooner than this.  He has requested to have his referral sent to another GI office to see if he could have it scheduled before the end of the year due to insurance.  In basket message sent from patients Nov 2024 colonoscopy referral to his PCP to make her aware.  Thank you,  Rosaline, [facility]

## 2023-09-10 NOTE — Telephone Encounter (Signed)
 Attempted to return call to patient to schedule his colonoscopy however he was not available.  Not sure if voice mail was working because it picked up but didn't give option to lvm.  Thanks,  [place], CMA

## 2023-09-28 ENCOUNTER — Other Ambulatory Visit: Payer: Self-pay

## 2023-09-28 ENCOUNTER — Emergency Department
Admission: EM | Admit: 2023-09-28 | Discharge: 2023-09-28 | Disposition: A | Discharge status: Home / Self Care | Source: Ambulatory Visit | Attending: Emergency Medicine | Admitting: Emergency Medicine

## 2023-09-28 ENCOUNTER — Encounter: Payer: Self-pay | Admitting: Family

## 2023-09-28 ENCOUNTER — Ambulatory Visit
Admission: RE | Admit: 2023-09-28 | Discharge: 2023-09-28 | Disposition: A | Discharge status: Home / Self Care | Source: Ambulatory Visit | Attending: Family | Admitting: Family

## 2023-09-28 ENCOUNTER — Ambulatory Visit: Payer: Self-pay | Admitting: Family

## 2023-09-28 ENCOUNTER — Encounter: Payer: Self-pay | Admitting: Emergency Medicine

## 2023-09-28 ENCOUNTER — Ambulatory Visit: Admitting: Family

## 2023-09-28 ENCOUNTER — Telehealth: Payer: Self-pay

## 2023-09-28 VITALS — BP 114/74 | HR 67 | Temp 98.9°F | Ht 69.5 in | Wt 155.4 lb

## 2023-09-28 DIAGNOSIS — R634 Abnormal weight loss: Secondary | ICD-10-CM

## 2023-09-28 DIAGNOSIS — R1031 Right lower quadrant pain: Secondary | ICD-10-CM

## 2023-09-28 DIAGNOSIS — T781XXA Other adverse food reactions, not elsewhere classified, initial encounter: Secondary | ICD-10-CM

## 2023-09-28 DIAGNOSIS — R109 Unspecified abdominal pain: Secondary | ICD-10-CM | POA: Diagnosis present

## 2023-09-28 DIAGNOSIS — R1012 Left upper quadrant pain: Secondary | ICD-10-CM | POA: Diagnosis present

## 2023-09-28 DIAGNOSIS — R112 Nausea with vomiting, unspecified: Secondary | ICD-10-CM | POA: Diagnosis not present

## 2023-09-28 DIAGNOSIS — R8290 Unspecified abnormal findings in urine: Secondary | ICD-10-CM

## 2023-09-28 DIAGNOSIS — R1114 Bilious vomiting: Secondary | ICD-10-CM | POA: Diagnosis present

## 2023-09-28 DIAGNOSIS — K5900 Constipation, unspecified: Secondary | ICD-10-CM | POA: Diagnosis not present

## 2023-09-28 DIAGNOSIS — K2100 Gastro-esophageal reflux disease with esophagitis, without bleeding: Secondary | ICD-10-CM

## 2023-09-28 DIAGNOSIS — R630 Anorexia: Secondary | ICD-10-CM | POA: Insufficient documentation

## 2023-09-28 DIAGNOSIS — R1084 Generalized abdominal pain: Secondary | ICD-10-CM | POA: Diagnosis not present

## 2023-09-28 LAB — COMPREHENSIVE METABOLIC PANEL WITH GFR
ALT: 10 U/L (ref 0–53)
AST: 12 U/L (ref 0–37)
Albumin: 4.5 g/dL (ref 3.5–5.2)
Alkaline Phosphatase: 46 U/L (ref 39–117)
BUN: 13 mg/dL (ref 6–23)
CO2: 32 meq/L (ref 19–32)
Calcium: 10.3 mg/dL (ref 8.4–10.5)
Chloride: 102 meq/L (ref 96–112)
Creatinine, Ser: 0.97 mg/dL (ref 0.40–1.50)
GFR: 85.45 mL/min (ref >60.00)
Glucose, Bld: 100 mg/dL — ABNORMAL HIGH (ref 70–99)
Potassium: 5.1 meq/L (ref 3.5–5.1)
Sodium: 139 meq/L (ref 135–145)
Total Bilirubin: 0.7 mg/dL (ref 0.2–1.2)
Total Protein: 7.4 g/dL (ref 6.0–8.3)

## 2023-09-28 LAB — CBC WITH DIFFERENTIAL/PLATELET
Basophils Absolute: 0 K/uL (ref 0.0–0.1)
Basophils Relative: 0.4 % (ref 0.0–3.0)
Eosinophils Absolute: 0.1 K/uL (ref 0.0–0.7)
Eosinophils Relative: 1.7 % (ref 0.0–5.0)
HCT: 46.4 % (ref 39.0–52.0)
Hemoglobin: 15.5 g/dL (ref 13.0–17.0)
Lymphocytes Relative: 26.9 % (ref 12.0–46.0)
Lymphs Abs: 1.4 K/uL (ref 0.7–4.0)
MCHC: 33.3 g/dL (ref 30.0–36.0)
MCV: 88.1 fl (ref 78.0–100.0)
Monocytes Absolute: 0.3 K/uL (ref 0.1–1.0)
Monocytes Relative: 4.8 % (ref 3.0–12.0)
Neutro Abs: 3.5 K/uL (ref 1.4–7.7)
Neutrophils Relative %: 66.2 % (ref 43.0–77.0)
Platelets: 222 K/uL (ref 150.0–400.0)
RBC: 5.27 Mil/uL (ref 4.22–5.81)
RDW: 13.5 % (ref 11.5–15.5)
WBC: 5.3 K/uL (ref 4.0–10.5)

## 2023-09-28 LAB — LIPASE: Lipase: 20 U/L (ref 11.0–59.0)

## 2023-09-28 LAB — TSH: TSH: 0.34 u[IU]/mL — ABNORMAL LOW (ref 0.35–5.50)

## 2023-09-28 LAB — AMYLASE: Amylase: 39 U/L (ref 27–131)

## 2023-09-28 LAB — POCT I-STAT CREATININE: Creatinine, Ser: 1 mg/dL (ref 0.61–1.24)

## 2023-09-28 MED ORDER — POLYETHYLENE GLYCOL 3350 17 G PO PACK: 17.0000 g | PACK | Freq: Every day | ORAL | 0 refills | Status: AC

## 2023-09-28 MED ORDER — ONDANSETRON 4 MG PO TBDP: 4.0000 mg | ORAL_TABLET | Freq: Three times a day (TID) | ORAL | 0 refills | Status: DC | PRN

## 2023-09-28 MED ORDER — POLYETHYLENE GLYCOL 3350 17 G PO PACK: 17.0000 g | PACK | Freq: Every day | ORAL | 0 refills | Status: DC

## 2023-09-28 MED ORDER — OMEPRAZOLE 20 MG PO CPDR: 20.0000 mg | DELAYED_RELEASE_CAPSULE | Freq: Every day | ORAL | 3 refills | Status: DC

## 2023-09-28 MED ORDER — IOHEXOL 300 MG/ML  SOLN
100.0000 mL | Freq: Once | INTRAMUSCULAR | Status: AC | PRN
  Administered 2023-09-28: 100 mL via INTRAVENOUS

## 2023-09-28 NOTE — ED Provider Notes (Signed)
 [facility] Emergency Department Provider Note     Event Date/Time   First MD Initiated Contact with Patient 09/28/23 1555     (approximate)   History   Abdominal Pain   HPI  Roger Kim is a 58 y.o. male presents to the ED sent by primary care for early appendicitis shown on CT scan performed earlier today.  Patient reports ongoing non localized  abdominal pain for a couple of weeks now, progressively worsening, that is more severe at night.  Associated symptoms include nausea with vomiting.  Patient also notes weight loss of 10 pounds in a short duration due to loss of appetite.  Denies fever and urinary symptoms. No changes in bowel movement.  Patient is asymptomatic during my evaluation. Reports Thursday night, unbearable abdominal pain.  Chart reviewed -patient had outpatient office visit earlier today with PCP for same complaint.  CT abdomen and pelvis performed showing early appendicitis.   Physical Exam   Triage Vital Signs: ED Triage Vitals  Encounter Vitals Group     BP 09/28/23 1451 129/89     Girls Systolic BP Percentile --      Girls Diastolic BP Percentile --      Boys Systolic BP Percentile --      Boys Diastolic BP Percentile --      Pulse Rate 09/28/23 1451 70     Resp 09/28/23 1451 16     Temp 09/28/23 1451 98.9 F (37.2 C)     Temp Source 09/28/23 1451 Oral     SpO2 09/28/23 1451 100 %     Weight 09/28/23 1452 150 lb (68 kg)     Height 09/28/23 1452 5' 10 (1.778 m)     Head Circumference --      Peak Flow --      Pain Score 09/28/23 1451 0     Pain Loc --      Pain Education --      Exclude from Growth Chart --     Most recent vital signs: Vitals:   09/28/23 1451  BP: 129/89  Pulse: 70  Resp: 16  Temp: 98.9 F (37.2 C)  SpO2: 100%    General Well appearing.  Awake, no distress.  HEENT NCAT.  CV:  Good peripheral perfusion.  RESP:  Normal effort.  ABD:  No distention. Soft, non tender in all 4 quadrants.     ED Results / Procedures / Treatments   Labs (all labs ordered are listed, but only abnormal results are displayed) Labs Reviewed - No data to display  RADIOLOGY  I personally viewed and evaluated these images as part of my medical decision making, as well as reviewing the written report by the radiologist.  CT ABDOMEN PELVIS W CONTRAST Result Date: 09/28/2023 CLINICAL DATA:  Abdominal pain, acute nonlocalized EXAM: CT ABDOMEN AND PELVIS WITH CONTRAST TECHNIQUE: Multidetector CT imaging of the abdomen and pelvis was performed using the standard protocol following bolus administration of intravenous contrast. RADIATION DOSE REDUCTION: This exam was performed according to the departmental dose-optimization program which includes automated exposure control, adjustment of the mA and/or kV according to patient size and/or use of iterative reconstruction technique. CONTRAST:  OMNIPAQUE  IOHEXOL  300 MG/ML  SOLN COMPARISON:  None Available. FINDINGS: Lower chest: Lung bases are clear. Hepatobiliary: No focal hepatic lesion. Normal gallbladder. No biliary duct dilatation. Common bile duct is normal. Pancreas: Pancreas is normal. No ductal dilatation. No pancreatic inflammation. Spleen: Normal spleen Adrenals/urinary tract:  Adrenal glands and kidneys are normal. The ureters and bladder normal. Stomach/Bowel: Stomach is normal. There is mild bowel wall thickening through the second portion the duodenum at the level of the ampulla (image 30/2. No pancreatic duct dilatation or inflammation. Remainder of the small bowel is normal. The appendix is mildly dilated 8-10 mm (image 58/series 2 and image 32/series 5. There is minimal inflammation associated the appendix. No gas within the appendix. Ascending, transverse and descending colon normal.  Rectum normal. Vascular/Lymphatic: Abdominal aorta is normal caliber. No periportal or retroperitoneal adenopathy. No pelvic adenopathy. Reproductive: Prostate  unremarkable Other: No free fluid. Musculoskeletal: No aggressive osseous lesion. IMPRESSION: 1. Mild thickening of the appendix throughout its course is concerning for early acute appendicitis. Recommend clinical correlation. 2. Normal gallbladder. 3. Mild thickening through the second portion duodenum. Differential would include physiologic nondistention versus duodenitis. No evidence of pancreatitis. These results will be called to the ordering clinician or representative by the Radiologist Assistant, and communication documented in the PACS or [employer]. Electronically Signed   By: Jackquline Boxer M.D.   On: 09/28/2023 13:43    PROCEDURES:  Critical Care performed: No  Procedures   MEDICATIONS ORDERED IN ED: Medications - No data to display   IMPRESSION / MDM / ASSESSMENT AND PLAN / ED COURSE  I reviewed the triage vital signs and the nursing notes.                             Clinical Course as of 09/28/23 1849  Fri Sep 28, 2023  1645 D/w Dr Derrill with Gen surgery. Will speak with patient at bedside for further management options of early appendicitis.  [MH]  1809 Patient assessed by Dr. Renay else at bedside.  Low suspicion for appendicitis.  Patient will not be needing admission.  Patient will follow-up in the office in 1 week following MiraLAX  and Gatorade use through the weekend [MH]    Clinical Course User Index [MH] Roger Rebbeca LABOR, PA-C   57 y.o. male presents to the emergency department for evaluation and treatment of abdominal pain/concerning CT abdomen pelvisfor early appendicitis. See HPI for further details.   Differential diagnosis includes, but is not limited to appendicitis, duodenitis, gastritis  Patient's presentation is most consistent with acute complicated illness / injury requiring diagnostic workup.  Chart reviewed.  Consulted with surgery and discussed case with Dr. Lane who will assess and speak with patient at bedside for further  management options. Pending disposition.  ----------------------------------------- 6:21 PM on 09/28/2023 -----------------------------------------  Dr. Lane reviewed CT scan with patient at bedside and would like to have patient colon cleanse with Miralax  and gatorade over the weekend and follow up in office in a week. Patient is in stable condition for discharge home at the time.   FINAL CLINICAL IMPRESSION(S) / ED DIAGNOSES   Final diagnoses:  Constipation, unspecified constipation type  Generalized abdominal pain   Rx / DC Orders   ED Discharge Orders          Ordered    polyethylene glycol (MIRALAX ) 17 g packet  Daily,   Status:  Discontinued        09/28/23 1848    polyethylene glycol (MIRALAX ) 17 g packet  Daily        09/28/23 1849           Note:  This document was prepared using Dragon voice recognition software and may include unintentional dictation errors.  Roger, Dominico Rod A, PA-C 09/28/23 1849    Floy Roberts, MD 09/29/23 [id number]

## 2023-09-28 NOTE — Consult Note (Signed)
 [facility] SURGICAL CONSULTATION NOTE (initial) - cpt: 99244 (Outpatient/ED)   HISTORY OF PRESENT ILLNESS (HPI):  58 y.o. male presented to [facility] ED today for evaluation of an abnormal outpatient CT scan. Patient reports a 2-week history of diffuse centralized abdominal pain.  He will not describe it as waxing and waning.  Described as quite intense and horrific.  He reports earlier this week he had episodes of nausea with vomiting.  Has had associated heartburn.  Denies fevers or chills.  Denies consistent bowel activity, denies melena or hematochezia.  Did not really view the emesis to describe it as bilious, coffee ground or any other manner.  He is well aware that his current labs are quite normal. Was instructed by primary care that his CT scan showed appendicitis he needed to come to the ED.  He actually has no history of right lower quadrant pain.  Denies any fevers or chills.  He reports that after his CT evaluation he had a flounder meal out, tolerated well denies any nausea or vomiting as a result of it.  Surgery is consulted by emergency department APP in this context for evaluation and management of abnormal CT scan.  PAST MEDICAL HISTORY (PMH):  Past Medical History:  Diagnosis Date   Allergic rhinitis    Chicken pox    Hypercholesteremia    Prostate atrophy      PAST SURGICAL HISTORY (PSH):  Past Surgical History:  Procedure Laterality Date   fish bone removed     was stuck in the voice box-same day surgery-age 71 or 21   LIPOMA EXCISION     several times-different parts of the body   thumb surgery       MEDICATIONS:  Prior to Admission medications   Medication Sig Start Date End Date Taking? Authorizing Provider  Ascorbic Acid (VITAMIN C) 1000 MG tablet Take 1,000 mg by mouth daily.    [provider]  fluticasone  (FLONASE ) 50 MCG/ACT nasal spray Place 2 sprays into both nostrils daily. 02/18/17   Defelice, Jeanette, NP  Multiple Vitamin  (MULTIVITAMIN) capsule Take 1 capsule by mouth daily.    [provider]  omeprazole  (PRILOSEC) 20 MG capsule Take 1 capsule (20 mg total) by mouth daily. 09/28/23   Corwin Antu, FNP  ondansetron  (ZOFRAN -ODT) 4 MG disintegrating tablet Take 1 tablet (4 mg total) by mouth every 8 (eight) hours as needed for nausea or vomiting. 09/28/23   Corwin Antu, FNP     ALLERGIES:  Allergies  Allergen Reactions   Penicillins Other (See Comments)    Unknown, when he was a child.      SOCIAL HISTORY:  Social History   Socioeconomic History   Marital status: Divorced    Spouse name: Not on file   Number of children: 1   Years of education: Not on file   Highest education level: Some college, no degree  Occupational History    Employer: [employer]  Tobacco Use   Smoking status: Former    Current packs/day: 0.00    Average packs/day: 1 pack/day for 6.0 years (6.0 ttl pk-yrs)    Types: Cigarettes    Start date: 03/20/1972    Quit date: 03/21/1978    Years since quitting: 45.5    Passive exposure: Past   Smokeless tobacco: Former   Tobacco comments:    Smoked from age 39 to 70-use to dip some around age of 27  Vaping Use   Vaping status: Never Used  Substance and  Sexual Activity   Alcohol use: No    Comment: rare   Drug use: Not Currently    Comment: use to do drugs but quit many years ago, quit marijuana around 2009   Sexual activity: Not Currently    Partners: Male  Other Topics Concern   Not on file  Social History Narrative   Fulltime: Warehouse distribution.    Social Drivers of Corporate investment banker Strain: Low Risk  (09/21/2023)   Overall Financial Resource Strain (CARDIA)    Difficulty of Paying Living Expenses: Not hard at all  Food Insecurity: No Food Insecurity (09/21/2023)   Hunger Vital Sign    Worried About Running Out of Food in the Last Year: Never true    Ran Out of Food in the Last Year: Never true  Transportation Needs: No Transportation Needs  (09/21/2023)   PRAPARE - Administrator, Civil Service (Medical): No    Lack of Transportation (Non-Medical): No  Physical Activity: Insufficiently Active (09/21/2023)   Exercise Vital Sign    Days of Exercise per Week: 1 day    Minutes of Exercise per Session: 60 min  Stress: No Stress Concern Present (09/21/2023)   [employer] of Occupational Health - Occupational Stress Questionnaire    Feeling of Stress: Only a little  Social Connections: Socially Isolated (09/21/2023)   Social Connection and Isolation Panel    Frequency of Communication with Friends and Family: Three times a week    Frequency of Social Gatherings with Friends and Family: Twice a week    Attends Religious Services: Patient declined    Database administrator or Organizations: No    Attends Engineer, structural: Not on file    Marital Status: Divorced  Catering manager Violence: Not on file     FAMILY HISTORY:  Family History  Problem Relation Age of Onset   Alzheimer's disease Mother    Throat cancer Father    Other Sister        lipoma   Breast cancer Sister    Other Brother        lipoma   Cancer Maternal Grandmother        not sure what kind   Bone cancer Maternal Grandfather    Cancer Paternal Grandmother        not sure of the type   Cancer Paternal Grandfather        not sure pf the type      REVIEW OF SYSTEMS:  Review of Systems  All other systems reviewed and are negative.    VITAL SIGNS:  Temp:  [98.9 F (37.2 C)] 98.9 F (37.2 C) (09/26 1451) Pulse Rate:  [67-70] 70 (09/26 1451) Resp:  [16] 16 (09/26 1451) BP: (114-129)/(74-89) 129/89 (09/26 1451) SpO2:  [96 %-100 %] 100 % (09/26 1451) Weight:  [68 kg-70.5 kg] 68 kg (09/26 1452)     Height: 5' 10 (177.8 cm) Weight: 68 kg BMI (Calculated): 21.52   INTAKE/OUTPUT:  No intake/output data recorded.  PHYSICAL EXAM:  Physical Exam Blood pressure 129/89, pulse 70, temperature 98.9 F (37.2 C), temperature  source Oral, resp. rate 16, height 5' 10 (1.778 m), weight 68 kg, SpO2 100%. Last Weight  Most recent update: 09/28/2023  2:52 PM    Weight  68 kg (150 lb)             CONSTITUTIONAL: Well developed, and nourished, appropriately responsive and aware without distress.   EYES: Sclera non-icteric.  EARS, NOSE, MOUTH AND THROAT:  The oropharynx is clear. Oral mucosa is pink and moist.    Hearing is intact to voice.  NECK: Trachea is midline, and there is no jugular venous distension.  LYMPH NODES:  Lymph nodes in the neck are not enlarged. RESPIRATORY:  Lungs are clear, and breath sounds are equal bilaterally.   Normal respiratory effort without pathologic use of accessory muscles. CARDIOVASCULAR: Heart is regular in rate and rhythm.   Well perfused.  GI: The abdomen is  soft, nontender, and nondistended. There were no palpable masses. I did not appreciate hepatosplenomegaly. There were normal bowel sounds. MUSCULOSKELETAL:  Symmetrical muscle tone appreciated in all four extremities. Warm without edema.  SKIN: Skin turgor is normal. No pathologic skin lesions appreciated.  NEUROLOGIC:  Motor and sensation appear grossly normal.  Cranial nerves are grossly without defect. PSYCH:  Alert and oriented to person, place and time. Affect is appropriate for situation.  Data Reviewed I have personally reviewed what is currently available of the patient's imaging, recent labs and medical records.    Labs:     Latest Ref Rng & Units 09/28/2023    8:35 AM 11/10/2022    9:16 AM 07/15/2021   11:13 AM  CBC  WBC 4.0 - 10.5 K/uL 5.3  4.2  5.0   Hemoglobin 13.0 - 17.0 g/dL 84.4  86.2  84.4   Hematocrit 39.0 - 52.0 % 46.4  42.0  46.9   Platelets 150.0 - 400.0 K/uL 222.0  227.0  195.0       Latest Ref Rng & Units 09/28/2023   12:29 PM 09/28/2023    8:35 AM 11/10/2022    9:16 AM  CMP  Glucose 70 - 99 mg/dL  899  95   BUN 6 - 23 mg/dL  13  16   Creatinine 9.38 - 1.24 mg/dL 8.99  9.02  9.12   Sodium  135 - 145 mEq/L  139  141   Potassium 3.5 - 5.1 mEq/L  5.1  4.4   Chloride 96 - 112 mEq/L  102  105   CO2 19 - 32 mEq/L  32  30   Calcium  8.4 - 10.5 mg/dL  89.6  9.6   Total Protein 6.0 - 8.3 g/dL  7.4    Total Bilirubin 0.2 - 1.2 mg/dL  0.7    Alkaline Phos 39 - 117 U/L  46    AST 0 - 37 U/L  12    ALT 0 - 53 U/L  10       Imaging studies:   Last 24 hrs: CT ABDOMEN PELVIS W CONTRAST Result Date: 09/28/2023 CLINICAL DATA:  Abdominal pain, acute nonlocalized EXAM: CT ABDOMEN AND PELVIS WITH CONTRAST TECHNIQUE: Multidetector CT imaging of the abdomen and pelvis was performed using the standard protocol following bolus administration of intravenous contrast. RADIATION DOSE REDUCTION: This exam was performed according to the departmental dose-optimization program which includes automated exposure control, adjustment of the mA and/or kV according to patient size and/or use of iterative reconstruction technique. CONTRAST:  100mL OMNIPAQUE  IOHEXOL  300 MG/ML  SOLN COMPARISON:  None Available. FINDINGS: Lower chest: Lung bases are clear. Hepatobiliary: No focal hepatic lesion. Normal gallbladder. No biliary duct dilatation. Common bile duct is normal. Pancreas: Pancreas is normal. No ductal dilatation. No pancreatic inflammation. Spleen: Normal spleen Adrenals/urinary tract: Adrenal glands and kidneys are normal. The ureters and bladder normal. Stomach/Bowel: Stomach is normal. There is mild bowel wall thickening through the second portion the duodenum  at the level of the ampulla (image 30/2. No pancreatic duct dilatation or inflammation. Remainder of the small bowel is normal. The appendix is mildly dilated 8-10 mm (image 58/series 2 and image 32/series 5. There is minimal inflammation associated the appendix. No gas within the appendix. Ascending, transverse and descending colon normal.  Rectum normal. Vascular/Lymphatic: Abdominal aorta is normal caliber. No periportal or retroperitoneal adenopathy. No  pelvic adenopathy. Reproductive: Prostate unremarkable Other: No free fluid. Musculoskeletal: No aggressive osseous lesion. IMPRESSION: 1. Mild thickening of the appendix throughout its course is concerning for early acute appendicitis. Recommend clinical correlation. 2. Normal gallbladder. 3. Mild thickening through the second portion duodenum. Differential would include physiologic nondistention versus duodenitis. No evidence of pancreatitis. These results will be called to the ordering clinician or representative by the Radiologist Assistant, and communication documented in the PACS or [employer]. Electronically Signed   By: Jackquline Boxer M.D.   On: 09/28/2023 13:43     Assessment/Plan:  58 y.o. male without signs or symptoms/clinical presentation consistent with appendicitis, presenting with a 2-week history of nighttime abdominal pain, intermittent nausea and vomiting with irregular bowel activity.  Complicated by pertinent comorbidities including:  Patient Active Problem List   Diagnosis Date Noted   Gastrointestinal food sensitivity 09/28/2023   Weight loss 09/28/2023   Gastroesophageal reflux disease with esophagitis without hemorrhage 09/28/2023   Right lower quadrant abdominal pain 09/28/2023   Left upper quadrant abdominal pain 09/28/2023   Bilious vomiting with nausea 09/28/2023   Seasonal allergies 11/10/2022   Degeneration of intervertebral disc of lumbar region with discogenic back pain and lower extremity pain 11/10/2022   Lipoma of right shoulder 11/10/2022   Elevated LDL cholesterol level 11/10/2022   Lipoma of right upper extremity 11/10/2022   Prediabetes 06/22/2017   Hyperlipidemia 12/29/2016   Chronic low back pain 06/22/2016   BPH with lower urinary tract symptoms without urinary obstruction 06/22/2016    - I do not believe this gentleman's presentation is consistent with appendicitis, and more likely has a lot to do with irregular bowel activity and the  impression of significant proximal stool within his colon with a distally decompressed empty colon.  - Exam is not consistent with appendicitis, he has no right lower quadrant tenderness and an appendicitis would clearly not be the source of this 2-week history.  - I believe a trial of home clear liquid diet over the weekend, with multiple doses of MiraLAX  mixed with Gatorade until his stools become clear.  Then possible resumption of a bland or BRAT type of diet, with outpatient follow-up with me next week.  Should he not continue to improve over the weekend I remain available to assist with his care.   All of the above findings and recommendations were discussed with the patient and  family(if present), and all of patient's questions were answered to their expressed satisfaction.  I personally spent a total of 60 minutes in the care of the patient today including preparing to see the patient, getting/reviewing separately obtained history, performing a medically appropriate exam/evaluation, counseling and educating, referring and communicating with other health care professionals, documenting clinical information in the EHR, and independently interpreting results.    Thank you for the opportunity to participate in this patient's care.   -- Honor Leghorn, M.D., FACS 09/28/2023, 6:16 PM

## 2023-09-28 NOTE — Progress Notes (Signed)
 Established Patient Office Visit  Subjective:      CC:  Chief Complaint  Patient presents with   Acute Visit    Abdominal pain, heartburn     HPI: Roger Kim is a 58 y.o. male presenting on 09/28/2023 for Acute Visit (Abdominal pain, heartburn ) .  Discussed the use of AI scribe software for clinical note transcription with the patient, who gave verbal consent to proceed.  History of Present Illness Roger Kim is a 58 year old male who presents with worsening abdominal pain, heartburn, and nausea.  He has been experiencing worsening abdominal pain, heartburn, and nausea over the past few months. The symptoms are primarily nocturnal but also occur during the day. The abdominal pain is severe, particularly at night, disrupting his sleep and causing significant fatigue.  On Wednesday night, he had a severe episode of nausea and vomiting, preceded by heartburn. He vomited multiple times and had minimal sleep, affecting his ability to work the following day. Despite dietary adjustments, he continues to experience abdominal pain and has lost weight due to reduced food intake.  He has been avoiding spicy foods and consuming a bland diet to minimize symptoms. He reports weight loss from 160-170 pounds to 150 pounds, attributed to reduced appetite and dietary changes. No diarrhea or constipation, but bowel movements are irregular due to eating habits. Occasionally notices thin stools but denies blood in stool. He reports a history of an enlarged prostate and ongoing nocturia, which has been a longstanding issue.  He has not been taking medications for heartburn recently as they were ineffective. Foods like watermelon and oatmeal are better tolerated. He has a history of changing his diet to lose weight, which initially improved symptoms, but recent exacerbation has been severe.  His symptoms have significantly impacted his lifestyle, reducing participation in activities he  enjoys, such as Retail banker and biking.      Wt Readings from Last 3 Encounters:  09/28/23 155 lb 6.4 oz (70.5 kg)  02/09/23 162 lb 12.8 oz (73.8 kg)  02/02/23 164 lb 6.4 oz (74.6 kg)          Social history:  Relevant past medical, surgical, family and social history reviewed and updated as indicated. Interim medical history since our last visit reviewed.  Allergies and medications reviewed and updated.  DATA REVIEWED: CHART IN EPIC     ROS: Negative unless specifically indicated above in HPI.    Current Outpatient Medications:    Ascorbic Acid (VITAMIN C) 1000 MG tablet, Take 1,000 mg by mouth daily., Disp: , Rfl:    fluticasone  (FLONASE ) 50 MCG/ACT nasal spray, Place 2 sprays into both nostrils daily., Disp: 16 g, Rfl: 0   Multiple Vitamin (MULTIVITAMIN) capsule, Take 1 capsule by mouth daily., Disp: , Rfl:    omeprazole  (PRILOSEC) 20 MG capsule, Take 1 capsule (20 mg total) by mouth daily., Disp: 30 capsule, Rfl: 3   ondansetron  (ZOFRAN -ODT) 4 MG disintegrating tablet, Take 1 tablet (4 mg total) by mouth every 8 (eight) hours as needed for nausea or vomiting., Disp: 20 tablet, Rfl: 0        Objective:        BP 114/74 (BP Location: Right Arm, Patient Position: Sitting, Cuff Size: Normal)   Pulse 67   Temp 98.9 F (37.2 C) (Temporal)   Ht 5' 9.5 (1.765 m)   Wt 155 lb 6.4 oz (70.5 kg)   SpO2 96%   BMI 22.62 kg/m   Physical Exam  MEASUREMENTS: Height- 5'10, Weight- 150. ABDOMEN: Abdomen not tender with mild tenderness in the left upper and right lower quadrants.  Wt Readings from Last 3 Encounters:  09/28/23 155 lb 6.4 oz (70.5 kg)  02/09/23 162 lb 12.8 oz (73.8 kg)  02/02/23 164 lb 6.4 oz (74.6 kg)    Physical Exam Constitutional:      General: He is not in acute distress.    Appearance: Normal appearance. He is normal weight. He is not ill-appearing, toxic-appearing or diaphoretic.  Cardiovascular:     Rate and Rhythm: Normal rate and regular  rhythm.  Pulmonary:     Effort: Pulmonary effort is normal.     Breath sounds: Normal breath sounds.  Abdominal:     General: Abdomen is flat. Bowel sounds are normal.     Tenderness: There is abdominal tenderness in the right lower quadrant and left upper quadrant. There is guarding. Negative signs include Murphy's sign, Rovsing's sign, McBurney's sign and psoas sign.     Hernia: No hernia is present.  Musculoskeletal:        General: Normal range of motion.  Neurological:     General: No focal deficit present.     Mental Status: He is alert and oriented to person, place, and time. Mental status is at baseline.  Psychiatric:        Mood and Affect: Mood normal.        Behavior: Behavior normal.        Thought Content: Thought content normal.        Judgment: Judgment normal.          Results   Assessment & Plan:   Assessment and Plan Assessment & Plan Chronic abdominal pain with nausea, vomiting, and weight loss Chronic abdominal pain with associated nausea and vomiting, worsening over the past few weeks. Significant weight loss from 160-170 lbs to 150 lbs. Pain is primarily nocturnal but occurs during the day. Differential diagnosis includes colitis, appendicitis, cholecystitis, pancreatitis, and less likely, malignancy. No diarrhea or constipation, but stool occasionally appears thin. No blood in stool. No significant dietary triggers identified, though he avoids spicy and fried foods. No significant urinary symptoms beyond baseline BPH-related nocturia. No recent colonoscopy, but one is scheduled for December 23rd. - Order CT scan of the abdomen to evaluate for appendicitis, colitis, cholecystitis, pancreatitis, and other potential causes of abdominal pain. - Perform lab work including calprotectin, celiac panel, and H. pylori testing. - Check urine sample for abnormalities. - Prescribe Prilosec for 30 days to manage symptoms. - Advise continuation of a bland diet, avoiding  fried and fatty foods, caffeine, chocolate, and tomato-based products. - Provide dietary guidelines for a bland diet, heartburn diet, and irritable bowel syndrome diet. -ER precautions advised  Gastroesophageal reflux disease (GERD) GERD symptoms have worsened, with increased heartburn and nausea, particularly at night. Symptoms are not well-controlled with current dietary modifications. No current use of antacids or other GERD medications. - Prescribe Prilosec for 30 days to manage GERD symptoms. - Advise continuation of dietary modifications to avoid GERD triggers such as fried foods, caffeine, chocolate, and tomato-based products.  Recording duration: 19 minutes      Return in about 2 weeks (around 10/12/2023) for follow up GI pain gerd.     Ginger Patrick, MSN, APRN, FNP-C [facility]

## 2023-09-28 NOTE — ED Triage Notes (Signed)
 Pt endorses abd pain for past few weeks. Pt had CT scan today that showed early appendicitis. Denies pain at this time but reports Wednesday the pain was unbearable with n/v. Labs collected today.

## 2023-09-28 NOTE — Discharge Instructions (Addendum)
 You were evaluated in the ED and spoke with our general surgeon who did not believe surgery at this time is needed.  We would like for you to use MiraLAX  and Gatorade over the weekend to cleanse out your colon.  Follow-up in the office in 1 week for further management.  If any new or worsening symptoms occur feel free to return to ED.  Use MiraLAX  as directed on the package, once daily.  You can mix this powder with 4 to 8 ounces of water or juice every hour until your next bowel movement.

## 2023-09-28 NOTE — Telephone Encounter (Signed)
 Roger Kim with [facility] called to report on the CT Abdomen Pelvis specifically # 1 under impression.   IMPRESSION: 1. Mild thickening of the appendix throughout its course is concerning for early acute appendicitis. Recommend clinical correlation.  PCP out of office, so also sending to provider in office.

## 2023-09-28 NOTE — Telephone Encounter (Signed)
 Thank you Matt.  I agree with plan as pt was symptomatic at visit.

## 2023-09-28 NOTE — Telephone Encounter (Signed)
 Called and discussed CT scan results briefly. He will head to local ED for further evaluation  Called report to lindsay, RN at [facility] ED

## 2023-09-30 ENCOUNTER — Ambulatory Visit: Payer: Self-pay | Admitting: Family

## 2023-09-30 DIAGNOSIS — R7989 Other specified abnormal findings of blood chemistry: Secondary | ICD-10-CM

## 2023-09-30 NOTE — Progress Notes (Signed)
 Reviewed CT abd pelvis.  Adina Crandall, FNP reviewed while I was out of office and sent pt to ER as he had inflammation around the appendix, and I had confirmed he was symptomatic. He also had some inflammation through the second portion of the duodenum. We will discuss this further upon hospital f/u

## 2023-10-02 ENCOUNTER — Ambulatory Visit: Payer: Self-pay

## 2023-10-02 NOTE — Telephone Encounter (Signed)
 FYI Only or Action Required?: FYI only for provider.  Patient was last seen in primary care on 09/28/2023 by Roger Antu, FNP.  Called Nurse Triage reporting Rash.  Symptoms began yesterday.  Interventions attempted: Rest, hydration, or home remedies.  Symptoms are: gradually worsening.  Triage Disposition: See PCP When Office is Open (Within 3 Days)  Patient/caregiver understands and will follow disposition?: Yes  Copied from CRM #[id number]. Topic: Clinical - Red Word Triage >> Oct 02, 2023 11:12 AM Roger Kim wrote: Red Word that prompted transfer to Nurse Triage: Patient said he had a CT scan last week Friday and thinks the dye may have caused a rash. Reason for Disposition  Mild widespread rash  (Exception: Heat rash lasting 3 days or less.)  Answer Assessment - Initial Assessment Questions Pt had CT scan Friday and worries he may be having a delayed rxn to IVP dye. States he noticed a rash begin on his thighs today. Denies pain/itching. Denies it being raised.   1. APPEARANCE of RASH: What does the rash look like? (e.g., blisters, dry flaky skin, red spots, redness, sores)     Red spots, over thighs close to genitals 2. SIZE: How big are the spots? (e.g., tip of pen, eraser, coin; inches, centimeters)     Less than 1cm each  3. LOCATION: Where is the rash located?     Thighs, spreading, close to genital  4. COLOR: What color is the rash? (Note: It is difficult to assess rash color in people with darker-colored skin. When this situation occurs, simply ask the caller to describe what they see.)     Purple,red 5. ONSET: When did the rash begin?     10 min ago 6. FEVER: Do you have a fever? If Yes, ask: What is your temperature, how was it measured, and when did it start?     denies 7. ITCHING: Does the rash itch? If Yes, ask: How bad is the itch? (Scale 1-10; or mild, moderate, severe)     denies 8. CAUSE: What do you think is causing the rash?     Potential  delayed reaction from IVP dye from CT scan on friday 9. MEDICINE FACTORS: Have you started any new medicines within the last 2 weeks? (e.g., antibiotics)      denies 10. OTHER SYMPTOMS: Do you have any other symptoms? (e.g., dizziness, headache, sore throat, joint pain)       denies  Protocols used: Rash or Redness - [facility]

## 2023-10-03 LAB — ALPHA-GAL PANEL
Allergen, Mutton, f88: <0.1 kU/L
Allergen, Pork, f26: <0.1 kU/L
Beef: <0.1 kU/L
CLASS: 0
CLASS: 0
Class: 0
GALACTOSE-ALPHA-1,3-GALACTOSE IGE*: <0.1 kU/L (ref <0.10)

## 2023-10-03 LAB — CELIAC DISEASE PANEL
(tTG) Ab, IgA: <1 U/mL
(tTG) Ab, IgG: <1 U/mL
Gliadin IgA: 1.6 U/mL
Gliadin IgG: <1 U/mL
Immunoglobulin A: 298 mg/dL (ref 47–310)

## 2023-10-03 LAB — INTERPRETATION:

## 2023-10-03 LAB — H. PYLORI BREATH TEST: H. pylori Breath Test: NOT DETECTED

## 2023-10-03 NOTE — Telephone Encounter (Signed)
 Copied from CRM #[id number]. Topic: Clinical - Medical Advice >> Oct 03, 2023  3:25 PM Berneda FALCON wrote: Reason for CRM: Patient is calling because he cannot respond to Dugal's questions in MyChart and had some additional questions and clinical information he would like to ask.  Please call patient at [phone]

## 2023-10-03 NOTE — Telephone Encounter (Signed)
 Spoke with pt and he says that things are a little better. He is not having any issues with feeling more anxious, warmer than usual, sweating more than usual or losing weight. His abdominal pain is slightly improved since he had a bowel movement on Sunday.

## 2023-10-04 ENCOUNTER — Ambulatory Visit: Admitting: Family

## 2023-10-05 ENCOUNTER — Encounter: Payer: Self-pay | Admitting: Physician Assistant

## 2023-10-09 ENCOUNTER — Ambulatory Visit (INDEPENDENT_AMBULATORY_CARE_PROVIDER_SITE_OTHER): Admitting: Surgery

## 2023-10-09 ENCOUNTER — Encounter: Payer: Self-pay | Admitting: Surgery

## 2023-10-09 VITALS — BP 105/66 | HR 72 | Ht 69.5 in | Wt 154.0 lb

## 2023-10-09 DIAGNOSIS — K5900 Constipation, unspecified: Secondary | ICD-10-CM | POA: Diagnosis not present

## 2023-10-09 DIAGNOSIS — R1084 Generalized abdominal pain: Secondary | ICD-10-CM | POA: Diagnosis not present

## 2023-10-09 DIAGNOSIS — R634 Abnormal weight loss: Secondary | ICD-10-CM | POA: Diagnosis not present

## 2023-10-09 NOTE — Patient Instructions (Signed)
 Follow-up with our office as needed.  Please call and ask to speak with a nurse if you develop questions or concerns.   Advised to pursue a goal of 25 to 30 g of fiber daily.  The majority of this may be through natural sources, advised to ensure a minimal daily fiber supplementation.  Various forms of supplements discussed.  Recommend Psyllium husk(Metamucil), with options of mixing with beverage or applesauce to make more tolerable. Strongly advised to consume more fluids(especially in proximity to fiber intake) and to ensure adequate hydration.   Watch color of urine to determine adequacy of hydration.  Clarity is pursued in urine output, and bowel activity that responds and corresponds to significant meal intake.   We need to avoid deferring having bowel movements, advised to take the time at the first sign of sensation, typically following meals, and in the morning.  The need to avoid more frequently, and the presence of flatus may indicate the need for bowel movement.  Do not defer for later.  Addition of MiraLAX (or its generic equivalent) may be needed ensure at least twice daily bowel movements.  If multiple doses of MiraLAX are necessary utilize them. Never skip a day... Do not tolerate a day without a bowel movement unless you are fasting.  To be regular, we must do the above EVERY day.   Soluble Fiber Dissolves in Water: Soluble fiber dissolves in water to form a gel-like substance. Slows Digestion: This type of fiber slows down digestion, which can help control blood sugar levels and lower cholesterol. Sources: Common sources include oats, beans, apples, citrus fruits, and psyllium. Benefits: Helps manage cholesterol levels. Aids in blood sugar control. Increases healthy gut bacteria, which can lower inflammation and improve digestion.  Insoluble Fiber Does Not Dissolve in Water: Insoluble fiber does not dissolve in water and remains mostly intact as it passes through the  digestive system. Adds Bulk to Stool: It adds bulk to stool, which helps promote regular bowel movements and prevent constipation. Sources: Common sources include whole grains, nuts, beans, and vegetables like cauliflower and potatoes. Benefits: Improves bowel health and regularity. Reduces the risk of colorectal conditions like hemorrhoids and diverticulitis. Supports insulin sensitivity in people with diabetes.  Both types of fiber are essential for overall health, and it's beneficial to include a 50/50 mix of both in your diet.

## 2023-10-09 NOTE — Addendum Note (Signed)
 Addended by: LANE SHOPE on: 10/09/2023 02:56 PM   Modules accepted: Orders

## 2023-10-09 NOTE — Progress Notes (Signed)
 Patient ID: JERIAN MORAIS, male   DOB: Aug 19, 1965, 58 y.o.   MRN: [id number]  Chief Complaint: Emergency room follow-up for abdominal pain, seen September 28, 2023.  History of Present Illness QUAMAINE WEBB is a 58 y.o. male with with a visit based on the history below: ED HPI Patient reports ongoing non localized  abdominal pain for a couple of weeks now, progressively worsening, that is more severe at night.  Associated symptoms include nausea with vomiting.  Patient also notes weight loss of 10 pounds in a short duration due to loss of appetite.  Denies fever and urinary symptoms. No changes in bowel movement.   Since being discharged from the ER, he reports that the MiraLAX  finally kicked in 2 days later.  He reports she has had good days and bad days since.  He has transitioned from a liquid diet and currently now trying to solid food.  He still has days where he skips a bowel movement, and even so he does not take MiraLAX  on a daily basis.  He is well aware that I have encouraged him to get on a fiber supplement and maintain adequate hydration.  He has been doing okay since a good bowel movement today.  Still denies seeing any blood in the stools.  Has a colonoscopy consultation scheduled for November 21, he is trying to get this done or completed sooner.  Past Medical History Past Medical History:  Diagnosis Date   Allergic rhinitis    Chicken pox    Hypercholesteremia    Prostate atrophy       Past Surgical History:  Procedure Laterality Date   fish bone removed     was stuck in the voice box-same day surgery-age 29 or 21   LIPOMA EXCISION     several times-different parts of the body   thumb surgery      Allergies  Allergen Reactions   Penicillins Other (See Comments)    Unknown, when he was a child.     Current Outpatient Medications  Medication Sig Dispense Refill   Ascorbic Acid (VITAMIN C) 1000 MG tablet Take 1,000 mg by mouth daily.     fluticasone  (FLONASE ) 50  MCG/ACT nasal spray Place 2 sprays into both nostrils daily. 16 g 0   Multiple Vitamin (MULTIVITAMIN) capsule Take 1 capsule by mouth daily.     polyethylene glycol (MIRALAX ) 17 g packet Take 17 g by mouth daily. 30 each 0   No current facility-administered medications for this visit.    Family History Family History  Problem Relation Age of Onset   Alzheimer's disease Mother    Throat cancer Father    Other Sister        lipoma   Breast cancer Sister    Other Brother        lipoma   Cancer Maternal Grandmother        not sure what kind   Bone cancer Maternal Grandfather    Cancer Paternal Grandmother        not sure of the type   Cancer Paternal Grandfather        not sure pf the type      Social History Social History   Tobacco Use   Smoking status: Former    Current packs/day: 0.00    Average packs/day: 1 pack/day for 6.0 years (6.0 ttl pk-yrs)    Types: Cigarettes    Start date: 03/20/1972    Quit date: 03/21/1978  Years since quitting: 45.5    Passive exposure: Past   Smokeless tobacco: Former   Tobacco comments:    Smoked from age 56 to 27-use to dip some around age of 7  Vaping Use   Vaping status: Never Used  Substance Use Topics   Alcohol use: No    Comment: rare   Drug use: Not Currently    Comment: use to do drugs but quit many years ago, quit marijuana around 2009        Review of Systems  All other systems reviewed and are negative.    Physical Exam Blood pressure 105/66, pulse 72, height 5' 9.5 (1.765 m), weight 154 lb (69.9 kg), SpO2 98%. Last Weight  Most recent update: 10/09/2023  2:25 PM    Weight  69.9 kg (154 lb)             CONSTITUTIONAL: Well developed, and nourished, appropriately responsive and aware without distress.   EYES: Sclera non-icteric.   EARS, NOSE, MOUTH AND THROAT:  The oropharynx is clear. Oral mucosa is pink and moist.    Hearing is intact to voice.  NECK: Trachea is midline, and there is no jugular venous  distension.  LYMPH NODES:  Lymph nodes in the neck are not appreciated. RESPIRATORY:  Lungs are clear, and breath sounds are equal bilaterally. Normal respiratory effort without pathologic use of accessory muscles. CARDIOVASCULAR: Heart is regular in rate and rhythm.   Well perfused.  GI: The abdomen is soft, nontender, and nondistended. There were no palpable masses.  I did not appreciate hepatosplenomegaly. There were normal bowel sounds.   MUSCULOSKELETAL:  Symmetrical muscle tone appreciated in all four extremities.    SKIN: Skin turgor is normal. No pathologic skin lesions appreciated.  NEUROLOGIC:  Motor and sensation appear grossly normal.  Cranial nerves are grossly without defect. PSYCH:  Alert and oriented to person, place and time. Affect is appropriate for situation.  Data Reviewed I have personally reviewed what is currently available of the patient's imaging, recent labs and medical records.   Labs:     Latest Ref Rng & Units 09/28/2023    8:35 AM 11/10/2022    9:16 AM 07/15/2021   11:13 AM  CBC  WBC 4.0 - 10.5 K/uL 5.3  4.2  5.0   Hemoglobin 13.0 - 17.0 g/dL 84.4  86.2  84.4   Hematocrit 39.0 - 52.0 % 46.4  42.0  46.9   Platelets 150.0 - 400.0 K/uL 222.0  227.0  195.0       Latest Ref Rng & Units 09/28/2023   12:29 PM 09/28/2023    8:35 AM 11/10/2022    9:16 AM  CMP  Glucose 70 - 99 mg/dL  899  95   BUN 6 - 23 mg/dL  13  16   Creatinine 9.38 - 1.24 mg/dL 8.99  9.02  9.12   Sodium 135 - 145 mEq/L  139  141   Potassium 3.5 - 5.1 mEq/L  5.1  4.4   Chloride 96 - 112 mEq/L  102  105   CO2 19 - 32 mEq/L  32  30   Calcium  8.4 - 10.5 mg/dL  89.6  9.6   Total Protein 6.0 - 8.3 g/dL  7.4    Total Bilirubin 0.2 - 1.2 mg/dL  0.7    Alkaline Phos 39 - 117 U/L  46    AST 0 - 37 U/L  12    ALT 0 - 53 U/L  10  Imaging: Radiological images personally reviewed:  I personally viewed and evaluated these images as part of my medical decision making, as well as reviewing the  written report by the radiologist.   CT ABDOMEN PELVIS W CONTRAST Result Date: 09/28/2023 CLINICAL DATA:  Abdominal pain, acute nonlocalized EXAM: CT ABDOMEN AND PELVIS WITH CONTRAST TECHNIQUE: Multidetector CT imaging of the abdomen and pelvis was performed using the standard protocol following bolus administration of intravenous contrast. RADIATION DOSE REDUCTION: This exam was performed according to the departmental dose-optimization program which includes automated exposure control, adjustment of the mA and/or kV according to patient size and/or use of iterative reconstruction technique. CONTRAST:  OMNIPAQUE  IOHEXOL  300 MG/ML  SOLN COMPARISON:  None Available. FINDINGS: Lower chest: Lung bases are clear. Hepatobiliary: No focal hepatic lesion. Normal gallbladder. No biliary duct dilatation. Common bile duct is normal. Pancreas: Pancreas is normal. No ductal dilatation. No pancreatic inflammation. Spleen: Normal spleen Adrenals/urinary tract: Adrenal glands and kidneys are normal. The ureters and bladder normal. Stomach/Bowel: Stomach is normal. There is mild bowel wall thickening through the second portion the duodenum at the level of the ampulla (image 30/2. No pancreatic duct dilatation or inflammation. Remainder of the small bowel is normal. The appendix is mildly dilated 8-10 mm (image 58/series 2 and image 32/series 5. There is minimal inflammation associated the appendix. No gas within the appendix. Ascending, transverse and descending colon normal.  Rectum normal. Vascular/Lymphatic: Abdominal aorta is normal caliber. No periportal or retroperitoneal adenopathy. No pelvic adenopathy. Reproductive: Prostate unremarkable Other: No free fluid. Musculoskeletal: No aggressive osseous lesion. IMPRESSION: 1. Mild thickening of the appendix throughout its course is concerning for early acute appendicitis. Recommend clinical correlation. 2. Normal gallbladder. 3. Mild thickening through the second portion  duodenum. Differential would include physiologic nondistention versus duodenitis. No evidence of pancreatitis. These results will be called to the ordering clinician or representative by the Radiologist Assistant, and communication documented in the PACS or [employer]. Electronically Signed   By: Jackquline Boxer M.D.   On: 09/28/2023 13:43  Within last 24 hrs: No results found.  Assessment    Is due for GI screening.  I believe his constipation is contributing to his intermittent abdominal pain symptoms. Patient Active Problem List   Diagnosis Date Noted   Gastrointestinal food sensitivity 09/28/2023   Weight loss 09/28/2023   Gastroesophageal reflux disease with esophagitis without hemorrhage 09/28/2023   Right lower quadrant abdominal pain 09/28/2023   Left upper quadrant abdominal pain 09/28/2023   Bilious vomiting with nausea 09/28/2023   Generalized abdominal pain 09/28/2023   Constipation 09/28/2023   Seasonal allergies 11/10/2022   Degeneration of intervertebral disc of lumbar region with discogenic back pain and lower extremity pain 11/10/2022   Lipoma of right shoulder 11/10/2022   Elevated LDL cholesterol level 11/10/2022   Lipoma of right upper extremity 11/10/2022   Prediabetes 06/22/2017   Hyperlipidemia 12/29/2016   Chronic low back pain 06/22/2016   BPH with lower urinary tract symptoms without urinary obstruction 06/22/2016    Plan    Recommendations for fiber and MiraLAX  discussed, and information below given to him.  He will attempt to expedite his colonoscopy.  He denies seeing any blood in his stools.  Advised to pursue a goal of 25 to 30 g of fiber daily.  The majority of this may be through natural sources, advised to ensure a minimal daily fiber supplementation.  Various forms of supplements discussed.  Recommend Psyllium husk, with options of mixing with beverage  or applesauce to make more tolerable. Strongly advised to consume more fluids(especially  in proximity to fiber intake) and to ensure adequate hydration.   Watch color of urine to determine adequacy of hydration.  Clarity is pursued in urine output, and bowel activity that responds and corresponds to significant meal intake.   We need to avoid deferring having bowel movements, advised to take the time at the first sign of sensation, typically following meals, and in the morning.  The need to avoid more frequently, and the presence of flatus may indicate the need for bowel movement.  Do not defer for later.  Addition of MiraLAX  (or its generic equivalent) may be needed ensure at least twice daily bowel movements.  If multiple doses of MiraLAX  are necessary utilize them. Never skip a day... Do not tolerate a day without a bowel movement unless you are fasting.  To be regular, we must do the above EVERY day.   Soluble Fiber Dissolves in Water: Soluble fiber dissolves in water to form a gel-like substance. Slows Digestion: This type of fiber slows down digestion, which can help control blood sugar levels and lower cholesterol. Sources: Common sources include oats, beans, apples, citrus fruits, and psyllium. Benefits: Helps manage cholesterol levels. Aids in blood sugar control. Increases healthy gut bacteria, which can lower inflammation and improve digestion.  Insoluble Fiber Does Not Dissolve in Water: Insoluble fiber does not dissolve in water and remains mostly intact as it passes through the digestive system. Adds Bulk to Stool: It adds bulk to stool, which helps promote regular bowel movements and prevent constipation. Sources: Common sources include whole grains, nuts, beans, and vegetables like cauliflower and potatoes. Benefits: Improves bowel health and regularity. Reduces the risk of colorectal conditions like hemorrhoids and diverticulitis. Supports insulin sensitivity in people with diabetes.  Both types of fiber are essential for overall health, and it's beneficial to  include a 50/50 mix of both in your diet.    I personally spent a total of 35 minutes in the care of the patient today including preparing to see the patient, getting/reviewing separately obtained history, performing a medically appropriate exam/evaluation, counseling and educating, and documenting clinical information in the EHR.   These notes generated with voice recognition software. I apologize for typographical errors.  Honor Leghorn M.D., FACS 10/09/2023, 2:49 PM

## 2023-10-12 ENCOUNTER — Ambulatory Visit: Admitting: Family

## 2023-10-12 ENCOUNTER — Encounter: Payer: Self-pay | Admitting: Family

## 2023-10-12 VITALS — BP 106/72 | HR 68 | Temp 98.1°F | Ht 69.5 in | Wt 150.4 lb

## 2023-10-12 DIAGNOSIS — R1114 Bilious vomiting: Secondary | ICD-10-CM | POA: Diagnosis not present

## 2023-10-12 DIAGNOSIS — R7989 Other specified abnormal findings of blood chemistry: Secondary | ICD-10-CM | POA: Diagnosis not present

## 2023-10-12 DIAGNOSIS — R634 Abnormal weight loss: Secondary | ICD-10-CM | POA: Diagnosis not present

## 2023-10-12 DIAGNOSIS — K2100 Gastro-esophageal reflux disease with esophagitis, without bleeding: Secondary | ICD-10-CM

## 2023-10-12 DIAGNOSIS — R1084 Generalized abdominal pain: Secondary | ICD-10-CM

## 2023-10-12 LAB — TSH: TSH: 1.74 u[IU]/mL (ref 0.35–5.50)

## 2023-10-12 LAB — T4, FREE: Free T4: 1.04 ng/dL (ref 0.60–1.60)

## 2023-10-12 LAB — T3, FREE: T3, Free: 3.3 pg/mL (ref 2.3–4.2)

## 2023-10-12 MED ORDER — OMEPRAZOLE 20 MG PO CPDR: 20.0000 mg | DELAYED_RELEASE_CAPSULE | Freq: Every day | ORAL | 3 refills | Status: DC

## 2023-10-12 MED ORDER — ONDANSETRON 4 MG PO TBDP: 4.0000 mg | ORAL_TABLET | Freq: Three times a day (TID) | ORAL | 0 refills | Status: AC | PRN

## 2023-10-12 MED ORDER — OMEPRAZOLE 20 MG PO CPDR: 20.0000 mg | DELAYED_RELEASE_CAPSULE | Freq: Every day | ORAL | 0 refills | Status: DC

## 2023-10-12 MED ORDER — OMEPRAZOLE 40 MG PO CPDR: 40.0000 mg | DELAYED_RELEASE_CAPSULE | Freq: Every day | ORAL | 0 refills | Status: DC

## 2023-10-12 NOTE — Progress Notes (Signed)
 Established Patient Office Visit  Subjective:      CC:  Chief Complaint  Patient presents with   Follow-up    2 week follow up    HPI: Roger Kim is a 58 y.o. male presenting on 10/12/2023 for Follow-up (2 week follow up) .  Discussed the use of AI scribe software for clinical note transcription with the patient, who gave verbal consent to proceed.  History of Present Illness Roger Kim is a 58 year old male who presents with ongoing gastrointestinal issues, including abdominal pain and vomiting.  He has been experiencing persistent, centralized abdominal pain with both good and bad days. The pain is unpredictable and distressing. He has been on a liquid diet, which has provided some relief, but significant discomfort persists. He is cautious about his diet, consuming bland foods like cantaloupe, skinless chicken breast, and oatmeal, which have not caused issues.  He has experienced weight loss and is concerned about losing more weight. On a particularly bad day, he experienced heartburn at work, had three bowel movements that were not diarrhea, and later vomited twice at home, which provided some relief. He has been prescribed Prilosec but has not been able to afford it due to missing work.  He has a history of bowel wall thickening and inflammation near the appendix, as seen on prior imaging. He recalls being told by a gastroenterologist that the upper part of his colon may be forming stones, which is not typical for that location. He has used Miralax , which helped initially. He is concerned about the possibility of irritable bowel disease, such as Crohn's or ulcerative colitis.  He denies any specific food intolerances, including dairy, and has been drinking one percent cow milk without issues. No symptoms like palpitations, heart racing, or feeling hot, which might suggest thyroid  issues, although he has been informed of potential thyroid  abnormalities.  He has  been missing work due to his symptoms and is seeking financial assistance through a [employer] fund at work. He recalls that his symptoms began last November and have worsened over the past two months. He is not concerned about job security but needs documentation for financial aid purposes.     Wt Readings from Last 3 Encounters:  10/12/23 150 lb 6.4 oz (68.2 kg)  10/09/23 154 lb (69.9 kg)  09/28/23 150 lb (68 kg)        Social history:  Relevant past medical, surgical, family and social history reviewed and updated as indicated. Interim medical history since our last visit reviewed.  Allergies and medications reviewed and updated.  DATA REVIEWED: CHART IN EPIC     ROS: Negative unless specifically indicated above in HPI.    Current Outpatient Medications:    Ascorbic Acid (VITAMIN C) 1000 MG tablet, Take 1,000 mg by mouth daily., Disp: , Rfl:    fluticasone  (FLONASE ) 50 MCG/ACT nasal spray, Place 2 sprays into both nostrils daily., Disp: 16 g, Rfl: 0   Multiple Vitamin (MULTIVITAMIN) capsule, Take 1 capsule by mouth daily., Disp: , Rfl:    omeprazole  (PRILOSEC) 40 MG capsule, Take 1 capsule (40 mg total) by mouth daily., Disp: 30 capsule, Rfl: 0   ondansetron  (ZOFRAN -ODT) 4 MG disintegrating tablet, Take 1 tablet (4 mg total) by mouth every 8 (eight) hours as needed for nausea or vomiting., Disp: 20 tablet, Rfl: 0   polyethylene glycol (MIRALAX ) 17 g packet, Take 17 g by mouth daily., Disp: 30 each, Rfl: 0  Objective:        BP 106/72 (BP Location: Left Arm, Patient Position: Sitting, Cuff Size: Normal)   Pulse 68   Temp 98.1 F (36.7 C) (Temporal)   Ht 5' 9.5 (1.765 m)   Wt 150 lb 6.4 oz (68.2 kg)   SpO2 98%   BMI 21.89 kg/m   Physical Exam ABDOMEN: Mild tenderness in the central abdomen.  Wt Readings from Last 3 Encounters:  10/12/23 150 lb 6.4 oz (68.2 kg)  10/09/23 154 lb (69.9 kg)  09/28/23 150 lb (68 kg)    Physical Exam Vitals reviewed.   Constitutional:      General: He is not in acute distress.    Appearance: Normal appearance. He is normal weight. He is not ill-appearing, toxic-appearing or diaphoretic.  Cardiovascular:     Rate and Rhythm: Normal rate.  Pulmonary:     Effort: Pulmonary effort is normal.  Abdominal:     General: Bowel sounds are normal.     Palpations: Abdomen is soft.     Tenderness: There is abdominal tenderness in the epigastric area. There is no guarding.  Musculoskeletal:        General: Normal range of motion.  Neurological:     General: No focal deficit present.     Mental Status: He is alert and oriented to person, place, and time. Mental status is at baseline.  Psychiatric:        Mood and Affect: Mood normal.        Behavior: Behavior normal.        Thought Content: Thought content normal.        Judgment: Judgment normal.          Results LABS Alpha-gal: Negative H. pylori: Negative  RADIOLOGY CT abdomen: Inflammation, mildly dilated appendix, mild bowel wall thickening through the second portion of the duodenum  Assessment & Plan:   Assessment and Plan Assessment & Plan Chronic abdominal pain with gastrointestinal symptoms (nausea, vomiting, diarrhea, weight loss) Chronic abdominal pain with associated gastrointestinal symptoms including nausea, vomiting, diarrhea, and weight loss. Symptoms are intermittent with recent episodes of vomiting and diarrhea. Weight loss of approximately 10 pounds since February. Symptoms impact daily activities and work attendance, causing anxiety about food intake. - Order calprotectin test to assess for inflammation - Prescribe Zofran  for nausea - Advise bland diet to manage symptoms - Advise against use of ibuprofen  due to potential gastrointestinal irritation  Suspected inflammatory bowel disease (IBD) Suspected inflammatory bowel disease, possibly Crohn's disease or ulcerative colitis, based on symptoms and imaging findings. CT abdomen  showed inflammation and mild bowel wall thickening. Differential diagnosis includes irritable bowel syndrome (IBS). Awaiting further evaluation by gastroenterology. Concern about delay in gastroenterology appointment and its impact on his condition. - Urgent referral to gastroenterology for further evaluation - Order calprotectin test to assess for inflammatory markers  Possible thyroid  dysfunction Possible thyroid  dysfunction with previous abnormal thyroid  function tests. No symptoms of hyperthyroidism such as palpitations or heat intolerance reported. - Repeat thyroid  function tests to confirm previous findings  Gastroesophageal reflux disease (GERD) Gastroesophageal reflux disease with symptoms of heartburn and acid reflux. Symptoms are intermittent and may contribute to nausea and vomiting. Inability to afford previously prescribed medication. - Prescribe omeprazole  40 mg daily for 30 days - Advise taking omeprazole  on an empty stomach 30 minutes before eating  General Health Maintenance Advised to maintain a bland diet and avoid foods that may exacerbate symptoms.  Ongoing health concerns: acute on chronic health  concerns have affected his daily life and work life. He has had to call out several times and be out of work due to his symptoms and his doctor visits. His initial visit with me with start of symptoms was back in November, 11/10/2022. Since then he has been seen with me e9/26/25 and 10/12/23. He had an ER visit 09/28/23 and a consult with general surgery 10/09/23. He has an upcoming GI appt that will require additional visits once evaluation and treatment plan has been established. CT abd pelvis 9/26 with duodenitis and acute appendicitis suspected of which require further evaluation as well. Medications have been prescribed, copays have been charged, resulting in financial burden on patient. Symptoms at times do not allow for pt to go to work which include nausea vomiting constipation and  moderate to severe abdominal pain when symptoms flare. Pt agrees for me to send him communications letter with details.        Return in about 2 weeks (around 10/26/2023) for f/u GI issues.     Ginger Patrick, MSN, APRN, FNP-C [facility]

## 2023-10-15 ENCOUNTER — Ambulatory Visit: Payer: Self-pay | Admitting: Family

## 2023-10-15 ENCOUNTER — Encounter: Payer: Self-pay | Admitting: Family

## 2023-10-16 ENCOUNTER — Telehealth: Payer: Self-pay | Admitting: Family

## 2023-10-16 NOTE — Telephone Encounter (Signed)
Pt came by and picked up form

## 2023-10-16 NOTE — Telephone Encounter (Signed)
 Spoke with pt and made him aware that I printed this letter and placed it up front for pick up.

## 2023-10-16 NOTE — Telephone Encounter (Signed)
 Copied from CRM #[id number]. Topic: General - Other >> Oct 16, 2023  2:21 PM Rosina BIRCH wrote: Reason for CRM: patient called stating he wants to come and pick up a copy of a letter that was sent to him on mychart for his job  [phone]

## 2023-10-17 LAB — THYROID STIMULATING IMMUNOGLOBULIN: TSI: <89 %{baseline} (ref <140)

## 2023-10-18 ENCOUNTER — Other Ambulatory Visit (INDEPENDENT_AMBULATORY_CARE_PROVIDER_SITE_OTHER): Payer: Self-pay

## 2023-10-18 DIAGNOSIS — R1084 Generalized abdominal pain: Secondary | ICD-10-CM

## 2023-10-18 LAB — FECAL OCCULT BLOOD, IMMUNOCHEMICAL: Fecal Occult Bld: NEGATIVE

## 2023-10-19 ENCOUNTER — Encounter (INDEPENDENT_AMBULATORY_CARE_PROVIDER_SITE_OTHER): Payer: Self-pay

## 2023-10-22 NOTE — Progress Notes (Signed)
 "  Roger Kim [id number] [birth year]   Chief Complaint: Nausea, vomiting, abdominal pain  Referring Provider: Corwin Antu, FNP Primary GI MD: Sampson  HPI: Roger Kim is a 58 y.o. male with past medical history of hypercholesterolemia who presents today for a complaint of nausea, vomiting, abdominal pain.    Referred by PCP for nausea and vomiting, GERD, weight loss, generalized abdominal pain. Seen by PCP 10/12/2023.  Noted to have chronic abdominal pain with nausea, vomiting, diarrhea, weight loss.  Weight loss of approximately 10 pounds since February.  Symptoms impacting daily activities and work attendance, food anxiety. Was prescribed omeprazole  40 mg, Zofran .  Workup so far includes negative H. pylori breath test, get up alpha gal, normal CBC without evidence of anemia, normal CMP, low TSH 0.34, negative celiac disease panel, normal amylase and lipase, negative fecal occult blood.  Fecal calprotectin was ordered but not completed.  CT A/P 09/28/2023 showed mild thickening of the appendix concerning for early acute appendicitis, normal gallbladder, mild thickening of the second portion of the duodenum, no evidence of pancreatitis.  Sent to the ED due to concern for appendicitis, and on discussion with general surgery there was low suspicion for appendicitis, and patient advised to use MiraLAX  and Gatorade throughout the weekend for constipation and generalized abdominal pain.   Discussed the use of AI scribe software for clinical note transcription with the patient, who gave verbal consent to proceed.  History of Present Illness Roger Kim is a 58 year old male who presents with abdominal pain, nausea, and vomiting, and is due for a colonoscopy. He was referred by his primary care provider for evaluation of his gastrointestinal symptoms and overdue colonoscopy.  He has been experiencing abdominal pain, nausea, and vomiting, which have improved since  starting Prilosec nine days ago. No heartburn, nausea, or vomiting since beginning the medication. He experienced some abdominal pain last night, likely due to dietary intake, but overall symptoms have improved significantly.  A CT scan performed a few weeks ago showed early signs of appendicitis and some issues with the small intestine. He went to the emergency room, where the on-call surgeon decided against an appendectomy. The CT scan also showed formed stool in his colon. Was advised to take Miralax .   He has been having regular bowel movements since starting Prilosec, with five to six movements yesterday, though they are not fully formed. Initially, he did not have a bowel movement for four days after starting Prilosec, which was resolved with Miralax . He has not needed Miralax  in the last few days as bowel movements have normalized.  No blood in stool, and recent stool specimens confirmed no blood presence. He has experienced weight loss due to his symptoms but has started to regain weight as his appetite returns.  He has a family history of cancer, including his father who had throat cancer and a sister with breast cancer. There is no known family history of colon cancer or inflammatory bowel disease.  He has a history of degenerative disc disease and an enlarged prostate. No ongoing heart or lung issues and has never had blood pressure problems.    Previous GI Procedures/Imaging      Past Medical History:  Diagnosis Date   Allergic rhinitis    Chicken pox    Hypercholesteremia    Lipoma of right upper extremity 11/10/2022   Prostate atrophy     Past Surgical History:  Procedure Laterality Date   fish bone removed  was stuck in the voice box-same day surgery-age 46 or 21   LIPOMA EXCISION     several times-different parts of the body   thumb surgery Right     Current Outpatient Medications  Medication Sig Dispense Refill   fluticasone  (FLONASE ) 50 MCG/ACT nasal spray  Place 2 sprays into both nostrils daily. (Patient taking differently: Place 2 sprays into both nostrils as needed.) 16 g 0   Multiple Vitamin (MULTIVITAMIN) capsule Take 1 capsule by mouth daily.     omeprazole  (PRILOSEC) 40 MG capsule Take 1 capsule (40 mg total) by mouth daily. 30 capsule 0   ondansetron  (ZOFRAN -ODT) 4 MG disintegrating tablet Take 1 tablet (4 mg total) by mouth every 8 (eight) hours as needed for nausea or vomiting. 20 tablet 0   Ascorbic Acid (VITAMIN C) 1000 MG tablet Take 1,000 mg by mouth daily. (Patient not taking: Reported on 10/23/2023)     polyethylene glycol (MIRALAX ) 17 g packet Take 17 g by mouth daily. (Patient not taking: Reported on 10/23/2023) 30 each 0   No current facility-administered medications for this visit.    Allergies as of 10/23/2023 - Review Complete 10/23/2023  Allergen Reaction Noted   Iodinated contrast media Rash 10/23/2023   Penicillins Other (See Comments) 12/01/2014    Family History  Problem Relation Age of Onset   Alzheimer's disease Mother    Gallbladder disease Mother    Throat cancer Father    Other Sister        lipoma   Breast cancer Sister    Other Brother        lipoma   Parkinson's disease Brother    Cancer Maternal Grandmother        not sure what kind   Bone cancer Maternal Grandfather    Cancer Paternal Grandmother        not sure of the type   Cancer Paternal Grandfather        not sure pf the type   Depression Daughter    Bipolar disorder Daughter     Social History   Tobacco Use   Smoking status: Former    Current packs/day: 0.00    Average packs/day: 1 pack/day for 6.0 years (6.0 ttl pk-yrs)    Types: Cigarettes    Start date: 03/20/1972    Quit date: 03/21/1978    Years since quitting: 45.6    Passive exposure: Past   Smokeless tobacco: Former   Tobacco comments:    Smoked from age 61 to 73-use to dip some around age of 25  Vaping Use   Vaping status: Never Used  Substance Use Topics   Alcohol  use: No    Comment: rare   Drug use: Not Currently    Comment: use to do drugs but quit many years ago, quit marijuana around 2009     Review of Systems:    Constitutional: Reported weight loss, now gaining some weight back, no fever or chills Cardiovascular: No chest pain Respiratory: No SOB or cough Gastrointestinal: See HPI and otherwise negative   Physical Exam:  Vital signs: BP 100/66 (BP Location: Left Arm, Patient Position: Sitting)   Pulse 68   Ht 5' 9 (1.753 m) Comment: height measured without shoes  Wt 154 lb 6 oz (70 kg)   BMI 22.80 kg/m   Wt Readings from Last 3 Encounters:  10/23/23 154 lb 6 oz (70 kg)  10/12/23 150 lb 6.4 oz (68.2 kg)  10/09/23 154 lb (69.9 kg)  Constitutional: Pleasant, well-appearing male in NAD, alert and cooperative Head:  Normocephalic and atraumatic.  Eyes: No scleral icterus.  Respiratory: Respirations even and unlabored. Lungs clear to auscultation bilaterally.  No wheezes, crackles, or rhonchi.  Cardiovascular:  Regular rate and rhythm. No murmurs. No peripheral edema. Gastrointestinal:  Soft, nondistended, nontender. No rebound or guarding. Normal bowel sounds. No appreciable masses or hepatomegaly. Rectal:  Not performed.  Neurologic:  Alert and oriented x4;  grossly normal neurologically.  Skin:   Dry and intact without significant lesions or rashes. Psychiatric: Oriented to person, place and time. Demonstrates good judgement and reason without abnormal affect or behaviors.   RELEVANT LABS AND IMAGING: CBC    Component Value Date/Time   WBC 5.3 09/28/2023 0835   RBC 5.27 09/28/2023 0835   HGB 15.5 09/28/2023 0835   HGB 15.0 01/12/2012 1427   HCT 46.4 09/28/2023 0835   HCT 44.3 01/12/2012 1427   PLT 222.0 09/28/2023 0835   PLT 202 01/12/2012 1427   MCV 88.1 09/28/2023 0835   MCV 89 01/12/2012 1427   MCH 30.0 01/12/2012 1427   MCHC 33.3 09/28/2023 0835   RDW 13.5 09/28/2023 0835   RDW 13.7 01/12/2012 1427   LYMPHSABS  1.4 09/28/2023 0835   MONOABS 0.3 09/28/2023 0835   EOSABS 0.1 09/28/2023 0835   BASOSABS 0.0 09/28/2023 0835    CMP     Component Value Date/Time   NA 139 09/28/2023 0835   NA 142 01/12/2012 1427   K 5.1 09/28/2023 0835   K 3.6 01/12/2012 1427   CL 102 09/28/2023 0835   CL 108 (H) 01/12/2012 1427   CO2 32 09/28/2023 0835   CO2 28 01/12/2012 1427   GLUCOSE 100 (H) 09/28/2023 0835   GLUCOSE 92 01/12/2012 1427   BUN 13 09/28/2023 0835   BUN 12 01/12/2012 1427   CREATININE 1.00 09/28/2023 1229   CREATININE 0.66 01/12/2012 1427   CALCIUM  10.3 09/28/2023 0835   CALCIUM  8.9 01/12/2012 1427   PROT 7.4 09/28/2023 0835   PROT 7.8 01/12/2012 1427   ALBUMIN 4.5 09/28/2023 0835   ALBUMIN 4.2 01/12/2012 1427   AST 12 09/28/2023 0835   AST 20 01/12/2012 1427   ALT 10 09/28/2023 0835   ALT 21 01/12/2012 1427   ALKPHOS 46 09/28/2023 0835   ALKPHOS 59 01/12/2012 1427   BILITOT 0.7 09/28/2023 0835   BILITOT 0.7 01/12/2012 1427   GFRNONAA >60 01/12/2012 1427   GFRAA >60 01/12/2012 1427     Assessment/Plan:    Screening for colon cancer Duodenitis GERD Nausea and vomiting Constipation Abnormal GI tract imaging Patient seen having been referred by PCP for nausea, vomiting, GERD, weight loss, generalized abdominal pain, and constipation.  Has had extensive workup which has so far been negative.  See HPI for more detail.  CT scan last month showed mild thickening of the appendix concerning for early acute appendicitis.  Also had mild thickening of the duodenum.  Was sent to the ED due to concern for appendicitis, but on discussion with general surgery there was low suspicion for appendicitis he was advised to use MiraLAX  for stool burden/constipation.  He started taking Prilosec about 9 days ago, and states that all of his symptoms have since resolved on the medication.  Not having any further heartburn, nausea, vomiting.  He started having regular bowel movements on MiraLAX , states  that now his bowel movements have normalized and he does not feel the need to continue it.  Not seeing any blood  in his stool.  No prior colonoscopy and is overdue for initial screening.  Denies family history of colon cancer.  Does have family history of throat cancer in his father.  Will plan on colonoscopy with addition of upper endoscopy due to new onset GERD and recent nausea and vomiting, finding of duodenitis on EGD.  - Schedule colonoscopy/EGD. I thoroughly discussed the procedure with the patient to include nature of the procedure, alternatives, benefits, and risks (including but not limited to bleeding, infection, perforation, anesthesia/cardiac/pulmonary complications). Patient verbalized understanding and gave verbal consent to proceed with procedure.  - Continue Prilosec 40 mg daily - Continue MiraLAX  as needed for constipation   Camie Furbish, PA-C [facility] 10/23/2023, 2:46 PM  Patient Care Team: Corwin Antu, FNP as PCP - General (Family Medicine)   "

## 2023-10-23 ENCOUNTER — Encounter: Payer: Self-pay | Admitting: Gastroenterology

## 2023-10-23 ENCOUNTER — Ambulatory Visit: Admitting: Gastroenterology

## 2023-10-23 VITALS — BP 100/66 | HR 68 | Ht 69.0 in | Wt 154.4 lb

## 2023-10-23 DIAGNOSIS — R933 Abnormal findings on diagnostic imaging of other parts of digestive tract: Secondary | ICD-10-CM

## 2023-10-23 DIAGNOSIS — Z1211 Encounter for screening for malignant neoplasm of colon: Secondary | ICD-10-CM | POA: Diagnosis not present

## 2023-10-23 DIAGNOSIS — R112 Nausea with vomiting, unspecified: Secondary | ICD-10-CM

## 2023-10-23 DIAGNOSIS — R634 Abnormal weight loss: Secondary | ICD-10-CM

## 2023-10-23 DIAGNOSIS — K219 Gastro-esophageal reflux disease without esophagitis: Secondary | ICD-10-CM | POA: Diagnosis not present

## 2023-10-23 DIAGNOSIS — K5900 Constipation, unspecified: Secondary | ICD-10-CM

## 2023-10-23 MED ORDER — NA SULFATE-K SULFATE-MG SULF 17.5-3.13-1.6 GM/177ML PO SOLN: 1.0000 | Freq: Once | ORAL | 0 refills | Status: AC

## 2023-10-23 NOTE — Patient Instructions (Signed)
 You have been scheduled for an endoscopy and colonoscopy. Please follow the written instructions given to you at your visit today.  If you use inhalers (even only as needed), please bring them with you on the day of your procedure.  DO NOT TAKE 7 DAYS PRIOR TO TEST- Trulicity (dulaglutide) Ozempic, Wegovy (semaglutide) Mounjaro (tirzepatide) Bydureon Bcise (exanatide extended release)  DO NOT TAKE 1 DAY PRIOR TO YOUR TEST Rybelsus (semaglutide) Adlyxin (lixisenatide) Victoza (liraglutide) Byetta (exanatide) ___________________________________________________________________________   _______________________________________________________  If your blood pressure at your visit was 140/90 or greater, please contact your primary care physician to follow up on this.  _______________________________________________________  If you are age 72 or older, your body mass index should be between 23-30. Your Body mass index is 22.8 kg/m. If this is out of the aforementioned range listed, please consider follow up with your Primary Care Provider.  If you are age 59 or younger, your body mass index should be between 19-25. Your Body mass index is 22.8 kg/m. If this is out of the aformentioned range listed, please consider follow up with your Primary Care Provider.   ________________________________________________________  The [facility] providers would like to encourage you to use MYCHART to communicate with providers for non-urgent requests or questions.  Due to long hold times on the telephone, sending your provider a message by [facility] may be a faster and more efficient way to get a response.  Please allow 48 business hours for a response.  Please remember that this is for non-urgent requests.  _______________________________________________________  [facility] is using a team-based approach to care.  Your team is made up of your doctor and two to three APPS. Our APPS (Nurse  Practitioners and Physician Assistants) work with your physician to ensure care continuity for you. They are fully qualified to address your health concerns and develop a treatment plan. They communicate directly with your gastroenterologist to care for you. Seeing the Advanced Practice Practitioners on your physician's team can help you by facilitating care more promptly, often allowing for earlier appointments, access to diagnostic testing, procedures, and other specialty referrals.

## 2023-10-24 ENCOUNTER — Encounter: Payer: Self-pay | Admitting: Gastroenterology

## 2023-10-24 LAB — CALPROTECTIN: Calprotectin: 358 ug/g — ABNORMAL HIGH

## 2023-10-31 ENCOUNTER — Telehealth: Payer: Self-pay | Admitting: Family

## 2023-10-31 NOTE — Telephone Encounter (Signed)
 Spoke to patient provided billing number [phone] for the information he is needing  Copied from CRM [id number]. Topic: General - Other >> Oct 31, 2023 10:07 AM Dedra B wrote: Reason for CRM: Pt is trying to get financial assistance form his job. He needs a copy of all the copays he has paid since 11/2022 in an email or hardcopy. Pls msg pt in MyChart if any questions.

## 2023-11-09 ENCOUNTER — Other Ambulatory Visit: Payer: Self-pay

## 2023-11-09 DIAGNOSIS — K2100 Gastro-esophageal reflux disease with esophagitis, without bleeding: Secondary | ICD-10-CM

## 2023-11-09 MED ORDER — OMEPRAZOLE 40 MG PO CPDR: 40.0000 mg | DELAYED_RELEASE_CAPSULE | Freq: Every day | ORAL | 0 refills | Status: AC

## 2023-11-23 ENCOUNTER — Ambulatory Visit: Admitting: Physician Assistant

## 2023-12-14 ENCOUNTER — Ambulatory Visit: Admitting: Gastroenterology

## 2023-12-14 ENCOUNTER — Encounter: Payer: Self-pay | Admitting: Gastroenterology

## 2023-12-14 VITALS — BP 115/66 | HR 67 | Temp 99.0°F | Resp 19 | Ht 69.0 in | Wt 154.6 lb

## 2023-12-14 DIAGNOSIS — R112 Nausea with vomiting, unspecified: Secondary | ICD-10-CM

## 2023-12-14 DIAGNOSIS — G8929 Other chronic pain: Secondary | ICD-10-CM

## 2023-12-14 DIAGNOSIS — R634 Abnormal weight loss: Secondary | ICD-10-CM

## 2023-12-14 DIAGNOSIS — D122 Benign neoplasm of ascending colon: Secondary | ICD-10-CM

## 2023-12-14 DIAGNOSIS — R194 Change in bowel habit: Secondary | ICD-10-CM

## 2023-12-14 MED ORDER — SODIUM CHLORIDE 0.9 % IV SOLN: 500.0000 mL | Freq: Once | INTRAVENOUS | Status: DC

## 2023-12-14 NOTE — Op Note (Signed)
 [facility] Patient Name: Roger Kim Procedure Date: 12/14/2023 2:37 PM MRN: [id number] Endoscopist: Gustav ALONSO Mcgee , MD, [id number] Age: 58 Referring MD:  Date of Birth: [birth year] Gender: Male Account #: [id number] Procedure:                Colonoscopy Indications:              Epigastric abdominal pain, Abnormal CT of the GI                            tract, Weight loss Medicines:                Monitored Anesthesia Care Procedure:                Pre-Anesthesia Assessment:                           - Prior to the procedure, a History and Physical                            was performed, and patient medications and                            allergies were reviewed. The patient's tolerance of                            previous anesthesia was also reviewed. The risks                            and benefits of the procedure and the sedation                            options and risks were discussed with the patient.                            All questions were answered, and informed consent                            was obtained. Prior Anticoagulants: The patient has                            taken no anticoagulant or antiplatelet agents. ASA                            Grade Assessment: II - A patient with mild systemic                            disease. After reviewing the risks and benefits,                            the patient was deemed in satisfactory condition to                            undergo the procedure.  After obtaining informed consent, the colonoscope                            was passed under direct vision. Throughout the                            procedure, the patient's blood pressure, pulse, and                            oxygen saturations were monitored continuously. The                            [id number]-HQ190L Colonoscope [id number] was introduced                            through the anus and advanced to the  the terminal                            ileum, with identification of the appendiceal                            orifice and IC valve. The colonoscopy was performed                            without difficulty. The patient tolerated the                            procedure well. The quality of the bowel                            preparation was good. The ileocecal valve,                            appendiceal orifice, and rectum were photographed. Scope In: 3:06:20 PM Scope Out: 3:19:17 PM Scope Withdrawal Time: 0 hours 9 minutes 29 seconds  Total Procedure Duration: 0 hours 12 minutes 57 seconds  Findings:                 The perianal and digital rectal examinations were                            normal.                           Two sessile polyps were found in the ascending                            colon. The polyps were 3 to 15 mm in size. These                            polyps were removed with a cold snare. Resection                            and retrieval were complete.  Non-bleeding external and internal hemorrhoids were                            found during retroflexion. The hemorrhoids were                            small. Complications:            No immediate complications. Estimated Blood Loss:     Estimated blood loss was minimal. Impression:               - Two 3 to 15 mm polyps in the ascending colon,                            removed with a cold snare. Resected and retrieved.                           - Non-bleeding external and internal hemorrhoids. Recommendation:           - Continue present medications.                           - Resume previous diet.                           - Await pathology results.                           - Repeat colonoscopy in 3 years for surveillance                            based on pathology results. Dorrie Cocuzza V. Aeson Sawyers, MD 12/14/2023 3:30:32 PM This report has been signed electronically.

## 2023-12-14 NOTE — Progress Notes (Signed)
 Called to room to assist during endoscopic procedure.  Patient ID and intended procedure confirmed with present staff. Received instructions for my participation in the procedure from the performing physician.

## 2023-12-14 NOTE — Patient Instructions (Signed)
 Discharge instructions given. Handouts on polyps and Hemorrhoids. No ibuprofen ,naproxen, or other non-steroidal anti-inflammatory drugs. Upper GI series to be scheduled by office. Follow up in GI office after upper GI series. YOU HAD AN ENDOSCOPIC PROCEDURE TODAY AT THE [facility]:   Refer to the procedure report that was given to you for any specific questions about what was found during the examination.  If the procedure report does not answer your questions, please call your gastroenterologist to clarify.  If you requested that your care partner not be given the details of your procedure findings, then the procedure report has been included in a sealed envelope for you to review at your convenience later.  YOU SHOULD EXPECT: Some feelings of bloating in the abdomen. Passage of more gas than usual.  Walking can help get rid of the air that was put into your GI tract during the procedure and reduce the bloating. If you had a lower endoscopy (such as a colonoscopy or flexible sigmoidoscopy) you may notice spotting of blood in your stool or on the toilet paper. If you underwent a bowel prep for your procedure, you may not have a normal bowel movement for a few days.  Please Note:  You might notice some irritation and congestion in your nose or some drainage.  This is from the oxygen used during your procedure.  There is no need for concern and it should clear up in a day or so.  SYMPTOMS TO REPORT IMMEDIATELY:  Following lower endoscopy (colonoscopy or flexible sigmoidoscopy):  Excessive amounts of blood in the stool  Significant tenderness or worsening of abdominal pains  Swelling of the abdomen that is new, acute  Fever of 100F or higher  Following upper endoscopy (EGD)  Vomiting of blood or coffee ground material  New chest pain or pain under the shoulder blades  Painful or persistently difficult swallowing  New shortness of breath  Fever of 100F or higher  Black,  tarry-looking stools  For urgent or emergent issues, a gastroenterologist can be reached at any hour by calling [phone]. Do not use MyChart messaging for urgent concerns.    DIET:  We do recommend a small meal at first, but then you may proceed to your regular diet.  Drink plenty of fluids but you should avoid alcoholic beverages for 24 hours.  ACTIVITY:  You should plan to take it easy for the rest of today and you should NOT DRIVE or use heavy machinery until tomorrow (because of the sedation medicines used during the test).    FOLLOW UP: Our staff will call the number listed on your records the next business day following your procedure.  We will call around 7:15- 8:00 am to check on you and address any questions or concerns that you may have regarding the information given to you following your procedure. If we do not reach you, we will leave a message.     If any biopsies were taken you will be contacted by phone or by letter within the next 1-3 weeks.  Please call us  at [phone] if you have not heard about the biopsies in 3 weeks.    SIGNATURES/CONFIDENTIALITY: You and/or your care partner have signed paperwork which will be entered into your electronic medical record.  These signatures attest to the fact that that the information above on your After Visit Summary has been reviewed and is understood.  Full responsibility of the confidentiality of this discharge information lies with you and/or your  care-partner.

## 2023-12-14 NOTE — Progress Notes (Unsigned)
 VS by EJ

## 2023-12-14 NOTE — Op Note (Signed)
 [facility] Patient Name: Roger Kim Procedure Date: 12/14/2023 2:47 PM MRN: [id number] Endoscopist: Gustav ALONSO Mcgee , MD, [id number] Age: 58 Referring MD:  Date of Birth: 06-21-1965 Gender: Male Account #: [id number] Procedure:                Upper GI endoscopy Indications:              Epigastric abdominal pain, Nausea with vomiting,                            Weight loss Medicines:                Monitored Anesthesia Care Procedure:                Pre-Anesthesia Assessment:                           - Prior to the procedure, a History and Physical                            was performed, and patient medications and                            allergies were reviewed. The patient's tolerance of                            previous anesthesia was also reviewed. The risks                            and benefits of the procedure and the sedation                            options and risks were discussed with the patient.                            All questions were answered, and informed consent                            was obtained. Prior Anticoagulants: The patient has                            taken no anticoagulant or antiplatelet agents. ASA                            Grade Assessment: II - A patient with mild systemic                            disease. After reviewing the risks and benefits,                            the patient was deemed in satisfactory condition to                            undergo the procedure.  After obtaining informed consent, the endoscope was                            passed under direct vision. Throughout the                            procedure, the patient's blood pressure, pulse, and                            oxygen saturations were monitored continuously. The                            Olympus scope [id number] was introduced through the                            mouth, and advanced to the second part  of duodenum.                            The upper GI endoscopy was accomplished without                            difficulty. The patient tolerated the procedure                            well. Scope In: Scope Out: Findings:                 The Z-line was regular and was found 38 cm from the                            incisors.                           No gross lesions were noted in the entire esophagus.                           The stomach was normal.                           The cardia and gastric fundus were normal on                            retroflexion.                           An acquired moderate stenosis was found in the                            duodenal sweep between first and second portion of                            the duodenum and was non-traversed. Biopsies were                            taken with a cold forceps for histology. Complications:  No immediate complications. Estimated Blood Loss:     Estimated blood loss was minimal. Impression:               - Z-line regular, 38 cm from the incisors.                           - No gross lesions in the entire esophagus.                           - Normal stomach.                           - Acquired duodenal stenosis. Biopsied. Recommendation:           - Resume previous diet. soft, low residue diet                           - Continue present medications.                           - Await pathology results.                           - No ibuprofen , naproxen, or other non-steroidal                            anti-inflammatory drugs.                           - Upper GI series with follow through to be                            scheduled                           - Follow up in GI office after upper GI series Gustav ALONSO Mcgee, MD 12/14/2023 3:34:28 PM This report has been signed electronically.

## 2023-12-14 NOTE — Progress Notes (Unsigned)
 [facility] History and Physical   Primary Care Physician:  Corwin Antu, FNP   Reason for Procedure:  Abdominal pain, nausea, vomiting, unintentional weight loss, abnormal CT   Plan:    EGD and colonoscopy with possible interventions as needed     HPI: Roger Kim is a very pleasant 58 y.o. male here for EGD and colonoscopy  for abdominal pain, nausea, vomiting, unintentional weight loss, abnormal CT .  Please refer to office visit by Camie Furbish note for details  The risks and benefits as well as alternatives of endoscopic procedure(s) have been discussed and reviewed.  The patient was provided an opportunity to ask questions and all were answered. The patient agreed with the plan and demonstrated an understanding of the instructions.   Past Medical History:  Diagnosis Date   Allergic rhinitis    Allergy    Chicken pox    Hypercholesteremia    Lipoma of right upper extremity 11/10/2022   Prostate atrophy     Past Surgical History:  Procedure Laterality Date   fish bone removed     was stuck in the voice box-same day surgery-age 63 or 21   LIPOMA EXCISION     several times-different parts of the body   thumb surgery Right     Prior to Admission medications  Medication Sig Start Date End Date Taking? Authorizing Provider  fluticasone  (FLONASE ) 50 MCG/ACT nasal spray Place 2 sprays into both nostrils daily. Patient taking differently: Place 2 sprays into both nostrils as needed. 02/18/17  Yes Defelice, Jeanette, NP  Multiple Vitamin (MULTIVITAMIN) capsule Take 1 capsule by mouth daily.   Yes [provider]  omeprazole  (PRILOSEC) 40 MG capsule Take 1 capsule (40 mg total) by mouth daily. 11/09/23  Yes Dugal, Antu, FNP  Ascorbic Acid (VITAMIN C) 1000 MG tablet Take 1,000 mg by mouth daily. Patient not taking: No sig reported    [provider]  ondansetron  (ZOFRAN -ODT) 4 MG disintegrating tablet Take 1 tablet (4 mg total) by mouth  every 8 (eight) hours as needed for nausea or vomiting. 10/12/23   Dugal, Tabitha, FNP  polyethylene glycol (MIRALAX ) 17 g packet Take 17 g by mouth daily. Patient not taking: No sig reported 09/28/23   Margrette Monte A, PA-C    Current Outpatient Medications  Medication Sig Dispense Refill   fluticasone  (FLONASE ) 50 MCG/ACT nasal spray Place 2 sprays into both nostrils daily. (Patient taking differently: Place 2 sprays into both nostrils as needed.) 16 g 0   Multiple Vitamin (MULTIVITAMIN) capsule Take 1 capsule by mouth daily.     omeprazole  (PRILOSEC) 40 MG capsule Take 1 capsule (40 mg total) by mouth daily. 30 capsule 0   Ascorbic Acid (VITAMIN C) 1000 MG tablet Take 1,000 mg by mouth daily. (Patient not taking: No sig reported)     ondansetron  (ZOFRAN -ODT) 4 MG disintegrating tablet Take 1 tablet (4 mg total) by mouth every 8 (eight) hours as needed for nausea or vomiting. 20 tablet 0   polyethylene glycol (MIRALAX ) 17 g packet Take 17 g by mouth daily. (Patient not taking: No sig reported) 30 each 0   Current Facility-Administered Medications  Medication Dose Route Frequency Provider Last Rate Last Admin   0.9 %  sodium chloride infusion  500 mL Intravenous Once Queenie Aufiero V, MD        Allergies as of 12/14/2023 - Review Complete 12/14/2023  Allergen Reaction Noted   Iodinated contrast media Rash 10/23/2023   Penicillins Other (  See Comments) 12/01/2014    Family History  Problem Relation Age of Onset   Alzheimer's disease Mother    Gallbladder disease Mother    Esophageal cancer Father    Throat cancer Father    Other Sister        lipoma   Breast cancer Sister    Other Brother        lipoma   Parkinson's disease Brother    Cancer Maternal Grandmother        not sure what kind   Bone cancer Maternal Grandfather    Cancer Paternal Grandmother        not sure of the type   Cancer Paternal Grandfather        not sure pf the type   Depression Daughter    Bipolar  disorder Daughter    Colon cancer Neg Hx    Rectal cancer Neg Hx    Stomach cancer Neg Hx     Social History   Socioeconomic History   Marital status: Divorced    Spouse name: Not on file   Number of children: 1   Years of education: Not on file   Highest education level: Some college, no degree  Occupational History    Employer: [employer]  Tobacco Use   Smoking status: Former    Current packs/day: 0.00    Average packs/day: 1 pack/day for 6.0 years (6.0 ttl pk-yrs)    Types: Cigarettes    Start date: 03/20/1972    Quit date: 03/21/1978    Years since quitting: 45.7    Passive exposure: Past   Smokeless tobacco: Former   Tobacco comments:    Smoked from age 74 to 45-use to dip some around age of 35  Vaping Use   Vaping status: Never Used  Substance and Sexual Activity   Alcohol use: No    Comment: rare   Drug use: Not Currently    Comment: use to do drugs but quit many years ago, quit marijuana around 2009   Sexual activity: Not Currently    Partners: Female  Other Topics Concern   Not on file  Social History Narrative   Fulltime: Warehouse distribution.    Social Drivers of Health   Tobacco Use: Medium Risk (12/14/2023)   Patient History    Smoking Tobacco Use: Former    Smokeless Tobacco Use: Former    Passive Exposure: Past  Physicist, Medical Strain: Low Risk (09/21/2023)   Overall Financial Resource Strain (CARDIA)    Difficulty of Paying Living Expenses: Not hard at all  Food Insecurity: No Food Insecurity (09/21/2023)   Epic    Worried About Programme Researcher, Broadcasting/film/video in the Last Year: Never true    Ran Out of Food in the Last Year: Never true  Transportation Needs: No Transportation Needs (09/21/2023)   Epic    Lack of Transportation (Medical): No    Lack of Transportation (Non-Medical): No  Physical Activity: Insufficiently Active (09/21/2023)   Exercise Vital Sign    Days of Exercise per Week: 1 day    Minutes of Exercise per Session: 60 min  Stress: No  Stress Concern Present (09/21/2023)   [employer] of Occupational Health - Occupational Stress Questionnaire    Feeling of Stress: Only a little  Social Connections: Socially Isolated (09/21/2023)   Social Connection and Isolation Panel    Frequency of Communication with Friends and Family: Three times a week    Frequency of Social Gatherings with Friends and  Family: Twice a week    Attends Religious Services: Patient declined    Active Member of Clubs or Organizations: No    Attends Engineer, Structural: Not on file    Marital Status: Divorced  Intimate Partner Violence: Not on file  Depression (PHQ2-9): Medium Risk (10/12/2023)   Depression (PHQ2-9)    PHQ-2 Score: 6  Alcohol Screen: Not on file  Housing: Low Risk (09/21/2023)   Epic    Unable to Pay for Housing in the Last Year: No    Number of Times Moved in the Last Year: 0    Homeless in the Last Year: No  Utilities: Not on file  Health Literacy: Not on file    Review of Systems:  All other review of systems negative except as mentioned in the HPI.  Physical Exam: Vital signs in last 24 hours: BP 130/80   Pulse 71   Temp 99 F (37.2 C)   Ht 5' 9 (1.753 m)   Wt 154 lb 9.6 oz (70.1 kg)   SpO2 99%   BMI 22.83 kg/m  General:   Alert, NAD Lungs:  Clear .   Heart:  Regular rate and rhythm Abdomen:  Soft, nontender and nondistended. Neuro/Psych:  Alert and cooperative. Normal mood and affect. A and O x 3  Reviewed labs, radiology imaging, old records and pertinent past GI work up  Patient is appropriate for planned procedure(s) and anesthesia in an ambulatory setting   K. Veena Cristian Grieves , MD [phone]

## 2023-12-14 NOTE — Progress Notes (Signed)
 A/o x 3, VSS, good SR's, pleased with anesthesia, report to RN

## 2023-12-17 ENCOUNTER — Telehealth: Payer: Self-pay

## 2023-12-17 NOTE — Telephone Encounter (Signed)
 No answer on follow-up call. Left VM for pt.

## 2023-12-17 NOTE — Telephone Encounter (Signed)
 Patient returning call stating he is doing just fine. Please advise  Thank you

## 2023-12-19 ENCOUNTER — Telehealth: Payer: Self-pay

## 2023-12-19 DIAGNOSIS — R112 Nausea with vomiting, unspecified: Secondary | ICD-10-CM

## 2023-12-19 DIAGNOSIS — R634 Abnormal weight loss: Secondary | ICD-10-CM

## 2023-12-19 DIAGNOSIS — G8929 Other chronic pain: Secondary | ICD-10-CM

## 2023-12-19 NOTE — Telephone Encounter (Signed)
 Post EGD Dr Shila ordered upper GI series and requested office visit post that study. Roger Kim is aware he has appointments for upper GI series at [facility] on 12/28/2023 arrive at 1:15pm for a 1:30 study to be done, NPO 3 hours. Office appointment made for Roger Failing, PA as Dr Shila had none. Set up for 02/01/2024 at 3:00pm.

## 2023-12-20 ENCOUNTER — Other Ambulatory Visit: Payer: Self-pay | Admitting: Family

## 2023-12-20 DIAGNOSIS — K2100 Gastro-esophageal reflux disease with esophagitis, without bleeding: Secondary | ICD-10-CM

## 2023-12-20 LAB — SURGICAL PATHOLOGY

## 2023-12-28 ENCOUNTER — Ambulatory Visit
Admission: RE | Admit: 2023-12-28 | Discharge: 2023-12-28 | Disposition: A | Discharge status: Home / Self Care | Source: Ambulatory Visit | Attending: Gastroenterology | Admitting: Gastroenterology

## 2023-12-28 DIAGNOSIS — R634 Abnormal weight loss: Secondary | ICD-10-CM | POA: Insufficient documentation

## 2023-12-28 DIAGNOSIS — R112 Nausea with vomiting, unspecified: Secondary | ICD-10-CM | POA: Diagnosis present

## 2023-12-28 DIAGNOSIS — G8929 Other chronic pain: Secondary | ICD-10-CM | POA: Diagnosis present

## 2023-12-28 DIAGNOSIS — R1013 Epigastric pain: Secondary | ICD-10-CM | POA: Diagnosis present

## 2024-01-22 ENCOUNTER — Ambulatory Visit: Payer: Self-pay | Admitting: Gastroenterology

## 2024-01-31 ENCOUNTER — Ambulatory Visit: Payer: Self-pay | Admitting: Gastroenterology

## 2024-02-01 ENCOUNTER — Ambulatory Visit: Admitting: Physician Assistant
